# Patient Record
Sex: Male | Born: 1955 | Race: White | Hispanic: No | Marital: Married | Smoking: Never smoker
Health system: Southern US, Community
[De-identification: ages and names within clinical notes are randomized; demographics above are authoritative.]

## PROBLEM LIST (undated history)

## (undated) DIAGNOSIS — E785 Hyperlipidemia, unspecified: Secondary | ICD-10-CM

## (undated) DIAGNOSIS — I82409 Acute embolism and thrombosis of unspecified deep veins of unspecified lower extremity: Secondary | ICD-10-CM

## (undated) DIAGNOSIS — I2699 Other pulmonary embolism without acute cor pulmonale: Secondary | ICD-10-CM

## (undated) HISTORY — DX: Acute embolism and thrombosis of unspecified deep veins of unspecified lower extremity: I82.409

## (undated) HISTORY — DX: Other pulmonary embolism without acute cor pulmonale: I26.99

## (undated) HISTORY — DX: Hyperlipidemia, unspecified: E78.5

## (undated) HISTORY — PX: NO PAST SURGERIES: SHX2092

## (undated) HISTORY — PX: BACK SURGERY: SHX140

---

## 2008-09-03 ENCOUNTER — Encounter: Admission: RE | Admit: 2008-09-03 | Discharge: 2008-09-03 | Payer: Self-pay | Admitting: Sports Medicine

## 2008-09-14 ENCOUNTER — Encounter: Admission: RE | Admit: 2008-09-14 | Discharge: 2008-09-14 | Payer: Self-pay | Admitting: Sports Medicine

## 2008-10-07 ENCOUNTER — Encounter: Admission: RE | Admit: 2008-10-07 | Discharge: 2008-10-07 | Payer: Self-pay | Admitting: Sports Medicine

## 2008-12-17 ENCOUNTER — Encounter: Admission: RE | Admit: 2008-12-17 | Discharge: 2008-12-17 | Payer: Self-pay | Admitting: Sports Medicine

## 2009-04-01 ENCOUNTER — Encounter: Admission: RE | Admit: 2009-04-01 | Discharge: 2009-04-01 | Payer: Self-pay | Admitting: Sports Medicine

## 2009-08-26 ENCOUNTER — Encounter: Admission: RE | Admit: 2009-08-26 | Discharge: 2009-08-26 | Payer: Self-pay | Admitting: Sports Medicine

## 2009-12-14 ENCOUNTER — Encounter: Admission: RE | Admit: 2009-12-14 | Discharge: 2009-12-14 | Payer: Self-pay | Admitting: Sports Medicine

## 2010-08-08 ENCOUNTER — Encounter: Admission: RE | Admit: 2010-08-08 | Discharge: 2010-08-08 | Payer: Self-pay | Admitting: Sports Medicine

## 2011-01-05 ENCOUNTER — Other Ambulatory Visit: Payer: Self-pay | Admitting: Sports Medicine

## 2011-01-05 DIAGNOSIS — M545 Low back pain, unspecified: Secondary | ICD-10-CM

## 2011-01-06 ENCOUNTER — Ambulatory Visit
Admission: RE | Admit: 2011-01-06 | Discharge: 2011-01-06 | Disposition: A | Discharge status: Home / Self Care | Payer: 59 | Source: Ambulatory Visit | Attending: Sports Medicine | Admitting: Sports Medicine

## 2011-01-06 ENCOUNTER — Other Ambulatory Visit: Payer: Self-pay | Admitting: Sports Medicine

## 2011-01-06 DIAGNOSIS — M545 Low back pain, unspecified: Secondary | ICD-10-CM

## 2011-01-26 ENCOUNTER — Other Ambulatory Visit: Payer: Self-pay | Admitting: Sports Medicine

## 2011-01-26 DIAGNOSIS — M549 Dorsalgia, unspecified: Secondary | ICD-10-CM

## 2011-02-01 ENCOUNTER — Other Ambulatory Visit: Payer: Self-pay | Admitting: Sports Medicine

## 2011-02-01 ENCOUNTER — Ambulatory Visit
Admission: RE | Admit: 2011-02-01 | Discharge: 2011-02-01 | Disposition: A | Discharge status: Home / Self Care | Payer: 59 | Source: Ambulatory Visit | Attending: Sports Medicine | Admitting: Sports Medicine

## 2011-02-01 DIAGNOSIS — M549 Dorsalgia, unspecified: Secondary | ICD-10-CM

## 2011-02-07 ENCOUNTER — Other Ambulatory Visit: Payer: Self-pay | Admitting: Sports Medicine

## 2011-02-07 DIAGNOSIS — M545 Low back pain, unspecified: Secondary | ICD-10-CM

## 2011-02-08 ENCOUNTER — Other Ambulatory Visit: Payer: Self-pay | Admitting: Sports Medicine

## 2011-02-08 ENCOUNTER — Ambulatory Visit
Admission: RE | Admit: 2011-02-08 | Discharge: 2011-02-08 | Disposition: A | Discharge status: Home / Self Care | Payer: 59 | Source: Ambulatory Visit | Attending: Sports Medicine | Admitting: Sports Medicine

## 2011-02-08 DIAGNOSIS — M545 Low back pain, unspecified: Secondary | ICD-10-CM

## 2011-02-08 DIAGNOSIS — M549 Dorsalgia, unspecified: Secondary | ICD-10-CM

## 2011-02-17 ENCOUNTER — Other Ambulatory Visit: Payer: 59

## 2011-03-30 ENCOUNTER — Other Ambulatory Visit: Payer: Self-pay | Admitting: Sports Medicine

## 2011-03-30 DIAGNOSIS — M549 Dorsalgia, unspecified: Secondary | ICD-10-CM

## 2011-04-19 ENCOUNTER — Other Ambulatory Visit: Payer: 59

## 2011-05-08 ENCOUNTER — Other Ambulatory Visit: Payer: Self-pay | Admitting: Sports Medicine

## 2011-05-08 ENCOUNTER — Ambulatory Visit
Admission: RE | Admit: 2011-05-08 | Discharge: 2011-05-08 | Disposition: A | Discharge status: Home / Self Care | Payer: 59 | Source: Ambulatory Visit | Attending: Sports Medicine | Admitting: Sports Medicine

## 2011-05-08 DIAGNOSIS — M549 Dorsalgia, unspecified: Secondary | ICD-10-CM

## 2011-05-08 MED ORDER — IOHEXOL 180 MG/ML  SOLN
1.0000 mL | Freq: Once | INTRAMUSCULAR | Status: AC | PRN
  Administered 2011-05-08: 1 mL via INTRA_ARTICULAR

## 2011-05-08 MED ORDER — METHYLPREDNISOLONE ACETATE 40 MG/ML INJ SUSP (RADIOLOG
120.0000 mg | Freq: Once | INTRAMUSCULAR | Status: AC
  Administered 2011-05-08: 120 mg via INTRA_ARTICULAR

## 2011-09-26 DIAGNOSIS — I2699 Other pulmonary embolism without acute cor pulmonale: Secondary | ICD-10-CM

## 2011-09-26 HISTORY — DX: Other pulmonary embolism without acute cor pulmonale: I26.99

## 2011-11-15 ENCOUNTER — Other Ambulatory Visit: Payer: Self-pay | Admitting: Sports Medicine

## 2011-11-15 DIAGNOSIS — M545 Low back pain, unspecified: Secondary | ICD-10-CM

## 2011-11-22 ENCOUNTER — Other Ambulatory Visit: Payer: Self-pay | Admitting: Sports Medicine

## 2011-11-22 DIAGNOSIS — M549 Dorsalgia, unspecified: Secondary | ICD-10-CM

## 2011-11-24 ENCOUNTER — Other Ambulatory Visit: Payer: 59

## 2011-11-27 ENCOUNTER — Other Ambulatory Visit: Payer: Self-pay | Admitting: Sports Medicine

## 2011-11-27 ENCOUNTER — Ambulatory Visit
Admission: RE | Admit: 2011-11-27 | Discharge: 2011-11-27 | Disposition: A | Discharge status: Home / Self Care | Payer: BC Managed Care – PPO | Source: Ambulatory Visit | Attending: Sports Medicine | Admitting: Sports Medicine

## 2011-11-27 DIAGNOSIS — M545 Low back pain, unspecified: Secondary | ICD-10-CM

## 2012-01-10 ENCOUNTER — Ambulatory Visit
Admission: RE | Admit: 2012-01-10 | Discharge: 2012-01-10 | Disposition: A | Discharge status: Home / Self Care | Payer: BC Managed Care – PPO | Source: Ambulatory Visit | Attending: Sports Medicine | Admitting: Sports Medicine

## 2012-01-10 ENCOUNTER — Other Ambulatory Visit: Payer: Self-pay | Admitting: Sports Medicine

## 2012-01-10 VITALS — BP 134/73 | HR 52 | Temp 94.5°F

## 2012-01-10 DIAGNOSIS — M549 Dorsalgia, unspecified: Secondary | ICD-10-CM

## 2012-01-10 MED ORDER — IOHEXOL 180 MG/ML  SOLN
1.0000 mL | Freq: Once | INTRAMUSCULAR | Status: AC | PRN
  Administered 2012-01-10: 1 mL via EPIDURAL

## 2012-01-10 MED ORDER — KETOROLAC TROMETHAMINE 30 MG/ML IJ SOLN: 30.0000 mg | Freq: Once | INTRAMUSCULAR | Status: DC

## 2012-01-10 MED ORDER — METHYLPREDNISOLONE ACETATE 40 MG/ML INJ SUSP (RADIOLOG
120.0000 mg | Freq: Once | INTRAMUSCULAR | Status: AC
  Administered 2012-01-10: 120 mg via EPIDURAL

## 2012-01-10 MED ORDER — MIDAZOLAM HCL 2 MG/2ML IJ SOLN: 1.0000 mg | INTRAMUSCULAR | Status: DC | PRN

## 2012-01-10 MED ORDER — CEFAZOLIN SODIUM 1-5 GM-% IV SOLN: 1.0000 g | Freq: Once | INTRAVENOUS | Status: DC

## 2012-01-10 MED ORDER — SODIUM CHLORIDE 0.9 % IV SOLN: Freq: Once | INTRAVENOUS | Status: DC

## 2012-01-10 MED ORDER — FENTANYL CITRATE 0.05 MG/ML IJ SOLN: 25.0000 ug | INTRAMUSCULAR | Status: DC | PRN

## 2012-01-10 NOTE — Discharge Instructions (Signed)
Radio Frequency Ablation Post Procedure Discharge Instructions ° °1. May resume a regular diet and any medications that you routinely take (including pain medications). °2. No driving day of procedure. °3. Upon discharge go home and rest for at least 4 hours.  May use an ice pack as needed to injection sites on back. °4. Remove bandades later, today. ° ° ° °Please contact our office at [phone] for the following symptoms: ° °· Fever greater than 100 degrees °· Increased swelling, pain, or redness at injection site. ° ° °Thank you for visiting [facility]. °

## 2012-01-10 NOTE — Progress Notes (Addendum)
Dr. Benard Rink in to talk to pt at length about procedure. Pt states no back pain today but has bad bilateral leg pain to knees. Pain changes from one side to the other as the day goes on. Discharge instructions explained. Dd 0800 Dr. Benard Rink wishes to speak to Dr. Margaretha Sheffield about doing an epi instead of ablation since pt has more leg pain and no back pain.dd  0915 RF ablation cancelled and l-epi done for bilateral leg pain, the injection was okayed by Dr. Charlett Blake.

## 2012-01-26 ENCOUNTER — Other Ambulatory Visit: Payer: Self-pay | Admitting: Sports Medicine

## 2012-01-26 DIAGNOSIS — M549 Dorsalgia, unspecified: Secondary | ICD-10-CM

## 2012-01-31 ENCOUNTER — Ambulatory Visit
Admission: RE | Admit: 2012-01-31 | Discharge: 2012-01-31 | Disposition: A | Discharge status: Home / Self Care | Payer: BC Managed Care – PPO | Source: Ambulatory Visit | Attending: Sports Medicine | Admitting: Sports Medicine

## 2012-01-31 DIAGNOSIS — M549 Dorsalgia, unspecified: Secondary | ICD-10-CM

## 2012-01-31 MED ORDER — METHYLPREDNISOLONE ACETATE 40 MG/ML INJ SUSP (RADIOLOG
120.0000 mg | Freq: Once | INTRAMUSCULAR | Status: AC
  Administered 2012-01-31: 120 mg via EPIDURAL

## 2012-01-31 MED ORDER — IOHEXOL 180 MG/ML  SOLN
1.0000 mL | Freq: Once | INTRAMUSCULAR | Status: AC | PRN
  Administered 2012-01-31: 1 mL via EPIDURAL

## 2012-03-06 ENCOUNTER — Other Ambulatory Visit: Payer: Self-pay | Admitting: Sports Medicine

## 2012-03-06 DIAGNOSIS — M549 Dorsalgia, unspecified: Secondary | ICD-10-CM

## 2012-03-19 ENCOUNTER — Ambulatory Visit
Admission: RE | Admit: 2012-03-19 | Discharge: 2012-03-19 | Disposition: A | Discharge status: Home / Self Care | Payer: BC Managed Care – PPO | Source: Ambulatory Visit | Attending: Sports Medicine | Admitting: Sports Medicine

## 2012-03-19 ENCOUNTER — Other Ambulatory Visit: Payer: Self-pay | Admitting: Sports Medicine

## 2012-03-19 VITALS — BP 131/76 | HR 50 | Temp 96.2°F | Resp 10

## 2012-03-19 DIAGNOSIS — M549 Dorsalgia, unspecified: Secondary | ICD-10-CM

## 2012-03-19 MED ORDER — CEFAZOLIN SODIUM 1-5 GM-% IV SOLN
1.0000 g | Freq: Once | INTRAVENOUS | Status: AC
  Administered 2012-03-19: 1 g via INTRAVENOUS

## 2012-03-19 MED ORDER — FENTANYL CITRATE 0.05 MG/ML IJ SOLN
25.0000 ug | INTRAMUSCULAR | Status: DC | PRN
  Administered 2012-03-19 (×2): 50 ug via INTRAVENOUS

## 2012-03-19 MED ORDER — MIDAZOLAM HCL 2 MG/2ML IJ SOLN
1.0000 mg | INTRAMUSCULAR | Status: DC | PRN
  Administered 2012-03-19 (×2): 1 mg via INTRAVENOUS

## 2012-03-19 MED ORDER — SODIUM CHLORIDE 0.9 % IV SOLN
Freq: Once | INTRAVENOUS | Status: AC
  Administered 2012-03-19 (×2): via INTRAVENOUS

## 2012-03-19 MED ORDER — KETOROLAC TROMETHAMINE 30 MG/ML IJ SOLN
30.0000 mg | Freq: Once | INTRAMUSCULAR | Status: AC
  Administered 2012-03-19: 30 mg via INTRAVENOUS

## 2012-03-19 NOTE — Progress Notes (Signed)
Dr. Benard Rink in to speak to pt and his wife, Kenneth Adams. Pt awake and alert at present.

## 2012-03-19 NOTE — Discharge Instructions (Signed)
Radio Frequency Ablation Procedure Discharge Instructions  1. May resume a regular diet and any medications that you routinely take (including pain medications). 2. No driving day of procedure. 3. Upon discharge go home and rest for at least 4 hours.  May use an ice pack as needed to injection sites on back. 4. Remove bandades later, today.    Please contact our office at [phone] for the following symptoms:   Fever greater than 100 degrees  Increased swelling, pain, or redness at injection site.   Thank you for visiting [facility].

## 2012-03-19 NOTE — Progress Notes (Signed)
Pt's only history is a history of back pain, but no surgeries in the past. Has had facet injections as well as epidural in the past with good relief.

## 2012-04-16 ENCOUNTER — Other Ambulatory Visit: Payer: Self-pay | Admitting: Orthopedic Surgery

## 2012-04-16 DIAGNOSIS — M545 Low back pain, unspecified: Secondary | ICD-10-CM

## 2012-08-13 ENCOUNTER — Ambulatory Visit
Admission: RE | Admit: 2012-08-13 | Discharge: 2012-08-13 | Disposition: A | Discharge status: Home / Self Care | Payer: BC Managed Care – PPO | Source: Ambulatory Visit | Attending: *Deleted | Admitting: *Deleted

## 2012-08-13 ENCOUNTER — Other Ambulatory Visit: Payer: Self-pay | Admitting: *Deleted

## 2012-08-13 DIAGNOSIS — M4310 Spondylolisthesis, site unspecified: Secondary | ICD-10-CM

## 2014-08-24 ENCOUNTER — Ambulatory Visit (HOSPITAL_BASED_OUTPATIENT_CLINIC_OR_DEPARTMENT_OTHER): Payer: BC Managed Care – PPO | Admitting: Radiology

## 2014-08-24 ENCOUNTER — Encounter: Payer: Self-pay | Admitting: Cardiology

## 2014-08-24 ENCOUNTER — Ambulatory Visit (INDEPENDENT_AMBULATORY_CARE_PROVIDER_SITE_OTHER): Payer: BC Managed Care – PPO | Admitting: Cardiology

## 2014-08-24 ENCOUNTER — Ambulatory Visit (HOSPITAL_COMMUNITY): Payer: BC Managed Care – PPO | Attending: Cardiology | Admitting: Cardiology

## 2014-08-24 VITALS — BP 120/82 | HR 65 | Ht 72.0 in | Wt 200.8 lb

## 2014-08-24 DIAGNOSIS — E785 Hyperlipidemia, unspecified: Secondary | ICD-10-CM | POA: Insufficient documentation

## 2014-08-24 DIAGNOSIS — R0602 Shortness of breath: Secondary | ICD-10-CM | POA: Diagnosis not present

## 2014-08-24 DIAGNOSIS — R079 Chest pain, unspecified: Secondary | ICD-10-CM | POA: Insufficient documentation

## 2014-08-24 DIAGNOSIS — R9431 Abnormal electrocardiogram [ECG] [EKG]: Secondary | ICD-10-CM

## 2014-08-24 DIAGNOSIS — R0789 Other chest pain: Secondary | ICD-10-CM

## 2014-08-24 DIAGNOSIS — I2699 Other pulmonary embolism without acute cor pulmonale: Secondary | ICD-10-CM | POA: Insufficient documentation

## 2014-08-24 MED ORDER — TECHNETIUM TC 99M SESTAMIBI GENERIC - CARDIOLITE
10.0000 | Freq: Once | INTRAVENOUS | Status: AC | PRN
  Administered 2014-08-24: 10 via INTRAVENOUS

## 2014-08-24 MED ORDER — TECHNETIUM TC 99M SESTAMIBI GENERIC - CARDIOLITE
30.0000 | Freq: Once | INTRAVENOUS | Status: AC | PRN
  Administered 2014-08-24: 30 via INTRAVENOUS

## 2014-08-24 NOTE — Patient Instructions (Addendum)
Dr. Mayford Knifeurner recommends you have a NUCLEAR STRESS TEST TOMORROW, DEC. 1st.  Your physician has requested that you have an echocardiogram TOMORROW, DEC. 1st. Echocardiography is a painless test that uses sound waves to create images of your heart. It provides your doctor with information about the size and shape of your heart and how well your heart's chambers and valves are working. This procedure takes approximately one hour. There are no restrictions for this procedure.  Your physician recommends that you schedule a follow-up appointment AS NEEDED with Dr. Mayford Knifeurner.

## 2014-08-24 NOTE — Progress Notes (Signed)
[address] [place], [place]  [address] Phone: [phone] Fax:  [phone]  Date:  08/24/2014   ID:  Kenneth PernaWilliam Adams, DOB Nov 10, 1955, MRN [id number]  PCP:  No primary care provider on file.  Cardiologist:  Kenneth Adams Kenneth Oliff, MD    History of Present Illness: Kenneth Adams is a 58 y.o. male with no prior cardiac history who recently saw his PCP, Dr. Foy Adams, for an episode of SOB about a month ago.  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   This occurred while walking on flat ground but in another city with higher altitude.  The symptoms were very "slight" and only lasted a few minutes. Recently he has had some chest pains that he has a hard time describing in the left upper chest but resolves with belching usually.  It is nonexertional.  He denies any diaphoresis or nausea with the pain.   He denies any dizziness, palpitations, LE edema or syncope.  He had an EKG on 05/31/2014 that was normal.  His cardiac risk factors include dyslipidemia, male sex and age.  He is a nonsmoker with no family history of CAD and no HTN or DM.  He is now referred to Cardiology for stress test.    Wt Readings from Last 3 Encounters:  08/24/14 200 lb 12.8 oz (91.082 kg)  11/27/11 195 lb (88.451 kg)     Past Medical History  Diagnosis Date  . Hyperlipidemia   . Pulmonary embolism 2013    occurred after back surgyer    Current Outpatient Prescriptions  Medication Sig Dispense Refill  . aspirin 81 MG chewable tablet Chew 81 mg by mouth.    . celecoxib (CELEBREX) 200 MG capsule Take 200 mg by mouth.    . rosuvastatin (CRESTOR) 10 MG tablet      No current facility-administered medications for this visit.    Allergies:    Allergies  Allergen Reactions  . Rivaroxaban Nausea Only    Other reaction(s): Other Extreme tiredness    Social History:  The patient  reports that he has never smoked. He has never used smokeless tobacco. He reports that he drinks  alcohol.   Family History:  The patient's family history includes Cancer - Lung in his mother.   ROS:  Please see the history of present illness.      All other systems reviewed and negative.   PHYSICAL EXAM: VS:  BP 120/82 mmHg  Pulse 65  Ht 6' (1.829 m)  Wt 200 lb 12.8 oz (91.082 kg)  BMI 27.23 kg/m2 Well nourished, well developed, in no acute distress HEENT: normal Neck: no JVD Cardiac:  normal S1, S2; RRR; no murmur Lungs:  clear to auscultation bilaterally, no wheezing, rhonchi or rales Abd: soft, nontender, no hepatomegaly Ext: no edema Skin: warm and dry Neuro:  CNs 2-12 intact, no focal abnormalities noted  EKG:     NSR at 65bpm with inferior infarct and anterior infarct - no change from EKG 2009  ASSESSMENT AND PLAN:  1. Atypical chest pain that he says started after he was told he needed to see a Cardiologist.  There are no associated symptoms with the discomfort and it usually resolves with belching.   2. SOB - 1 episode while walking on flat ground in a city at high altitude.  EKG shows poor R wave progression in the anterior leads and Q waves in III and aVF but this is unchanged from  2009.  I will get a stress myoview to rule out ischemia and 2D echo to assess LVF.  I have instructed him not to fly until stress test results in.   3. Dyslipidemia.  Continue Crestor  Followup with me PRN pending results of studies.  Signed, Kenneth Adams Farrell Broerman, MD [facility] 08/24/2014 9:45 AM

## 2014-08-24 NOTE — Progress Notes (Signed)
Echo performed. 

## 2014-08-24 NOTE — Progress Notes (Signed)
[facility] SITE 3 NUCLEAR MED [address] [place], [place] [address] [phone]    Cardiology Nuclear Med Study  Kenneth PernaWilliam Staub is a 58 y.o. male     MRN : [id number]     DOB: [birth year]  Procedure Date: 08/24/2014  Nuclear Med Background Indication for Stress Test:  Evaluation for Ischemia History:  MPI 15 yrs ago  Cardiac Risk Factors: Lipids  Symptoms:  Chest Pain and SOB   Nuclear Pre-Procedure Caffeine/Decaff Intake:  None> 12 hrs NPO After: 8:00pm   Lungs:  clear O2 Sat: 98% on room air. IV 0.9% NS with Angio Cath:  22g  IV Site: R Hand, tolerated well IV Started by:  Irean HongPatsy Edwards, RN  Chest Size (in):  44 Cup Size: n/a  Height: 6' (1.829 m)  Weight:  196 lb (88.905 kg)  BMI:  Body mass index is 26.58 kg/(m^2). Tech Comments:  N/A    Nuclear Med Study 1 or 2 day study: 1 day  Stress Test Type:  Stress  Reading MD: N/A  Order Authorizing Provider:  Armanda Magicraci Turner, MD  Resting Radionuclide: Technetium 4227m Sestamibi  Resting Radionuclide Dose: 11.0 mCi   Stress Radionuclide:  Technetium 2527m Sestamibi  Stress Radionuclide Dose: 33.0 mCi           Stress Protocol Rest HR: 56 Stress HR: 155  Rest BP: 129/66 Stress BP: 193/81  Exercise Time (min): 12:00 METS: 13.40   Predicted Max HR: 163 bpm % Max HR: 95.09 bpm Rate Pressure Product: [id number]   Dose of Adenosine (mg):  n/a Dose of Lexiscan: n/a mg  Dose of Atropine (mg): n/a Dose of Dobutamine: n/a mcg/kg/min (at max HR)  Stress Test Technologist: Milana NaSabrina Williams, EMT-P  Nuclear Technologist:  Kerby NoraElzbieta Kubak, CNMT     Rest Procedure:  Myocardial perfusion imaging was performed at rest 45 minutes following the intravenous administration of Technetium 4027m Sestamibi. Rest ECG: Sinus bradycardia. no ST or T wave changes.   Stress Procedure:  The patient exercised on the treadmill utilizing the Bruce Protocol for 12:00 minutes. The patient stopped due to fatigue and denied any chest pain.   Technetium 1827m Sestamibi was injected at peak exercise and myocardial perfusion imaging was performed after a brief delay. Stress ECG: No significant change from baseline ECG  QPS Raw Data Images:  Normal; no motion artifact; normal heart/lung ratio. Stress Images:  Normal homogeneous uptake in all areas of the myocardium. Rest Images:  Normal homogeneous uptake in all areas of the myocardium. Subtraction (SDS):  No evidence of ischemia. Transient Ischemic Dilatation (Normal <1.22):  0.84 Lung/Heart Ratio (Normal <0.45):  0.34  Quantitative Gated Spect Images QGS EDV:  96 ml QGS ESV:  36 ml  Impression Exercise Capacity:  Good exercise capacity. BP Response:  Normal blood pressure response. Clinical Symptoms:  No significant symptoms noted.  He had PVCs during the recovery phase and was able to feel them.  He stated that this was the same symptom that presented with.  ECG Impression:  No significant ST segment change suggestive of ischemia.  Occasional PVCs during the recovery phase.  Comparison with Prior Nuclear Study: No images to compare  Overall Impression:  Normal stress nuclear study.  No evidence of ischemia.   LV Ejection Fraction: 61%.  LV Wall Motion:  NL LV Function; NL Wall Motion.   Vesta MixerPhilip J. Nahser, Montez HagemanJr., MD, [facility] 08/24/2014, 4:57 PM [address],  [address] Office [phone] Pager [phone]

## 2014-08-26 ENCOUNTER — Telehealth: Payer: Self-pay | Admitting: Cardiology

## 2014-08-26 ENCOUNTER — Telehealth: Payer: Self-pay

## 2014-08-26 NOTE — Telephone Encounter (Signed)
Pt requests Dr. Mayford Knifeurner put the terms "clinically insignificant" to recent testing if possible (ECHO and stress test) in case his files are pulled by the [employer].   To Dr. Mayford Knifeurner for review.

## 2014-08-26 NOTE — Telephone Encounter (Signed)
Spoke with patient about recent stress test and ECHO results.  Added patients wife to demographics as DPR.

## 2014-08-26 NOTE — Telephone Encounter (Signed)
New message    Patient is giving permission  For his wife to receive information from nurse.    Patient is a pilot about to leave.

## 2014-08-26 NOTE — Telephone Encounter (Signed)
To whom it may concern, Kenneth Adams recently underwent stress myoview that showed no evidence of coronary ischemia and normal LVF.  2D echo showed normal LVF.  Patient is stable from a cardiac standpoint for continuing job as a Occupational hygienistpilot.

## 2014-09-04 ENCOUNTER — Telehealth: Payer: Self-pay | Admitting: Cardiology

## 2014-09-04 NOTE — Telephone Encounter (Signed)
Spoke with Natalia LeatherwoodKatherine and informed her that per our EPIC records, there are no documented calls to her office.  Reviewed results of stress test and ECHO.  Sending OV to office per request.

## 2014-09-04 NOTE — Telephone Encounter (Signed)
New Message  Received a call indicating the patient wasn't a candidate for CHF// please call to dicuss further

## 2014-11-03 ENCOUNTER — Other Ambulatory Visit: Payer: Self-pay | Admitting: Family Medicine

## 2014-11-03 DIAGNOSIS — Z8249 Family history of ischemic heart disease and other diseases of the circulatory system: Secondary | ICD-10-CM

## 2014-11-12 ENCOUNTER — Ambulatory Visit (INDEPENDENT_AMBULATORY_CARE_PROVIDER_SITE_OTHER): Payer: BLUE CROSS/BLUE SHIELD

## 2014-11-12 DIAGNOSIS — I77811 Abdominal aortic ectasia: Secondary | ICD-10-CM

## 2014-11-12 DIAGNOSIS — Z8249 Family history of ischemic heart disease and other diseases of the circulatory system: Secondary | ICD-10-CM

## 2015-01-05 ENCOUNTER — Encounter: Payer: Self-pay | Admitting: Vascular Surgery

## 2015-01-06 ENCOUNTER — Ambulatory Visit (INDEPENDENT_AMBULATORY_CARE_PROVIDER_SITE_OTHER): Payer: BLUE CROSS/BLUE SHIELD | Admitting: Vascular Surgery

## 2015-01-06 ENCOUNTER — Encounter: Payer: Self-pay | Admitting: Vascular Surgery

## 2015-01-06 VITALS — BP 125/80 | HR 58 | Ht 72.0 in | Wt 202.0 lb

## 2015-01-06 DIAGNOSIS — I723 Aneurysm of iliac artery: Secondary | ICD-10-CM

## 2015-01-06 DIAGNOSIS — I714 Abdominal aortic aneurysm, without rupture, unspecified: Secondary | ICD-10-CM

## 2015-01-06 NOTE — Progress Notes (Signed)
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  HPI: Kenneth Adams is a 59 y.o. male whose father had an abdominal aortic aneurysm diagnosed in his 60s. This reason an ultrasound screening study was recommended. This showed that he had an infrarenal aorta that measured 2.8 cm in maximum diameter and both common iliac arteries measured 1.8 cm in maximum diameter. He was therefore sent for vascular consultation. He denies any history of abdominal pain or back pain. He is not a smoker. His blood pressure has been well controlled.  I have reviewed the records from Dr. Joycelyn Rua office. The patient's father had an abdominal aortic aneurysm in his 58s. He therefore underwent a screening test for aneurysmal disease. He has normal renal function.  Past Medical History  Diagnosis Date  . Hyperlipidemia   . Pulmonary embolism 2013    occurred after back surgyer  . DVT (deep venous thrombosis)    Family History  Problem Relation Age of Onset  . Cancer - Lung Mother   . Cancer Mother   . Hyperlipidemia Mother   . Hyperlipidemia Father    He does have a family history of abdominal aortic aneurysms.  SOCIAL HISTORY: History  Substance Use Topics  . Smoking status: Never Smoker   . Smokeless tobacco: Never Used  . Alcohol Use: 3.0 oz/week    3 Glasses of wine, 2 Shots of liquor per week     Comment: 2-3x weekly   He is a Occupational hygienist.  Allergies  Allergen Reactions  . Rivaroxaban Nausea Only    Other reaction(s): Other Extreme tiredness   Current Outpatient Prescriptions  Medication Sig Dispense Refill  . aspirin 81 MG chewable tablet Chew 81 mg by mouth.    . celecoxib (CELEBREX) 200 MG capsule Take 200 mg by mouth.    . rosuvastatin (CRESTOR) 10 MG tablet      No current facility-administered medications for this visit.     REVIEW OF SYSTEMS: Arly.Keller ] denotes positive finding; [  ] denotes negative finding  CARDIOVASCULAR:   chest pain    chest pressure    palpitations    orthopnea    dyspnea on exertion    claudication    rest pain    DVT    phlebitis PULMONARY:    productive cough    asthma    wheezing NEUROLOGIC:    weakness   paresthesias   aphasia   amaurosis   dizziness HEMATOLOGIC:    bleeding problems    clotting disorders MUSCULOSKELETAL:   joint pain    joint swelling  leg swelling GASTROINTESTINAL:   blood in stool    hematemesis GENITOURINARY:    dysuria    hematuria PSYCHIATRIC:   history of major depression INTEGUMENTARY:   rashes   ulcers CONSTITUTIONAL:   fever    chills  PHYSICAL EXAM: Filed Vitals:   01/06/15 1009  BP: 125/80  Pulse: 58  Height: 6' (1.829 m)  Weight: 202 lb (91.627 kg)  SpO2: 100%   GENERAL: The patient is a well-nourished male, in no acute distress. The vital signs are documented above. CARDIOVASCULAR: There is a regular rate and rhythm. I do not check carotid  bruits. He has palpable femoral, popliteal, and posterior tibial pulses bilaterally. PULMONARY: There is good air exchange bilaterally without wheezing or rales. ABDOMEN: Soft and non-tender with normal pitched bowel sounds. I cannot palpate his aneurysm. MUSCULOSKELETAL: There are no major deformities or cyanosis. NEUROLOGIC: No focal weakness or paresthesias are detected. SKIN: There are no ulcers or rashes noted. PSYCHIATRIC: The patient has a normal affect.  DATA:  I have reviewed his ultrasound dated 11/12/2014 which shows that the maximum diameter of his infrarenal aorta is 2.8 cm. The iliac arteries are 1.8 cm in maximum diameter.  His lab work on 08/07/2014 shows a PSA of 1.6. HDL is 68. Triglycerides is 1:30.  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 Typically we would call this an aneurysm when the arteries 1-1/2 times normal which is approximately 3 cm. I have recommended a follow up ultrasound in 2 years. We will have him see our nurse practitioner at that time. If there has been no change at that time we can stretch the follow up out even further. Gently he is not a smoker. He is on aspirin and is on a statin.  DICKSON,CHRISTOPHER S [facility] Specialists of [place] Beeper: [phone]

## 2015-05-08 IMAGING — US US RETROPERITONEAL LIMITED
1 series · 14 of 25 positions shown · non-contrast
Comparison: None.

CLINICAL DATA: Family history of abdominal aortic aneurysm.

EXAM:
ULTRASOUND OF ABDOMINAL AORTA
TECHNIQUE: Ultrasound examination of the abdominal aorta was performed to
evaluate for abdominal aortic aneurysm.

[Series 1: us retroperitoneal limited · 0.27mm/px · 14 of 31 slices shown]
[im 1/31]
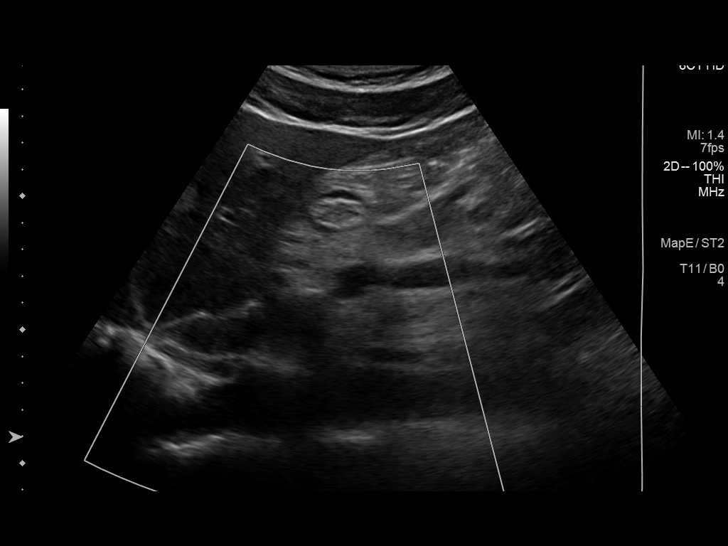
[im 3/31]
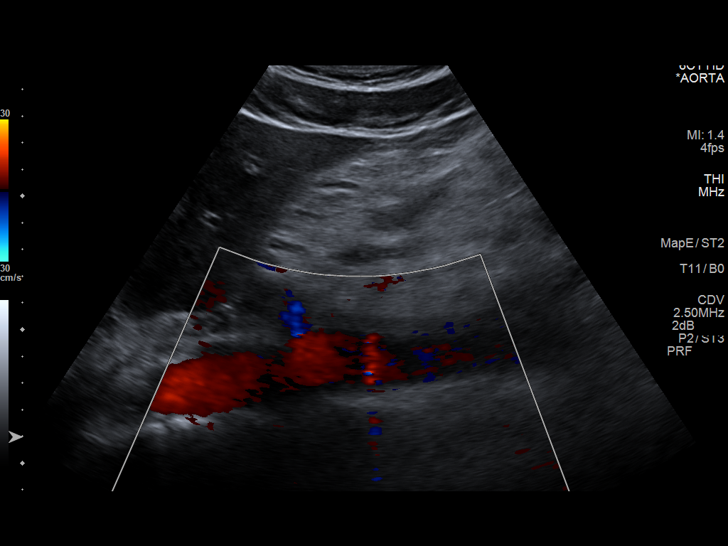
[im 6/31]
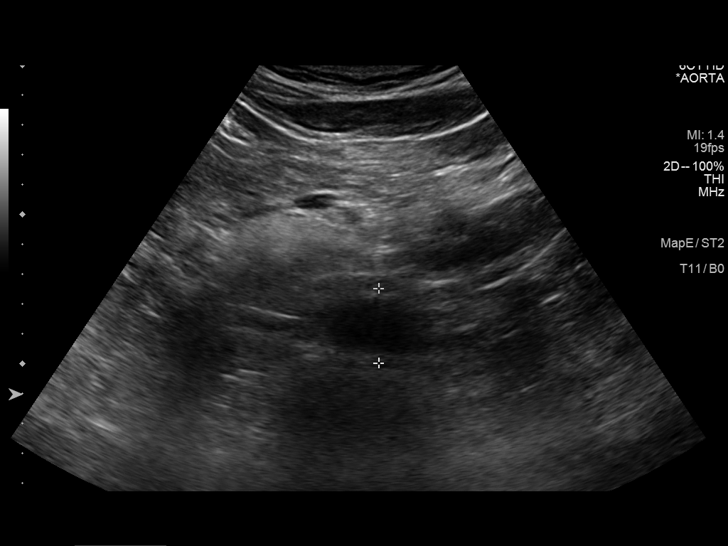
[im 8/31]
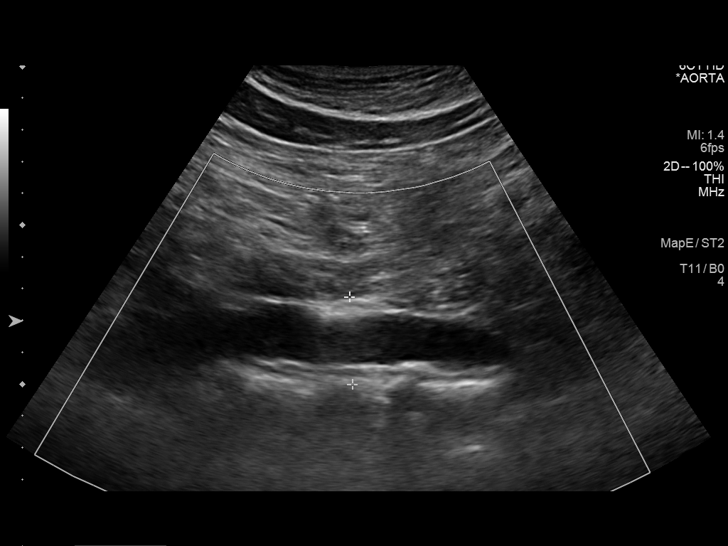
[im 11/31]
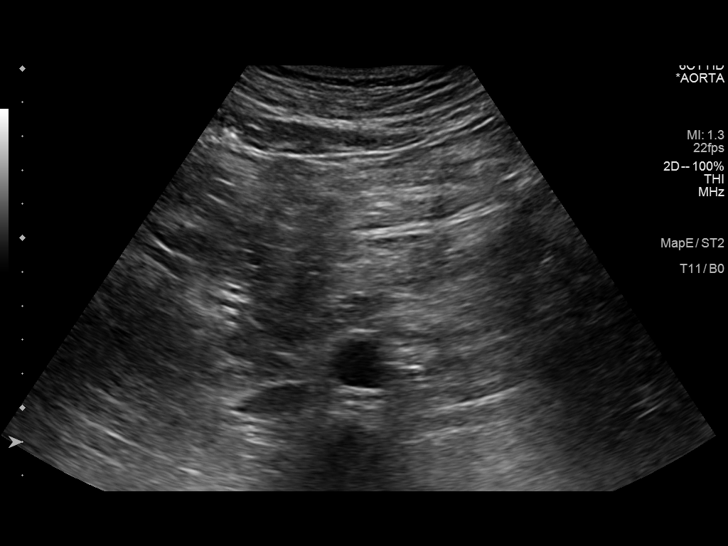
[im 12/31]
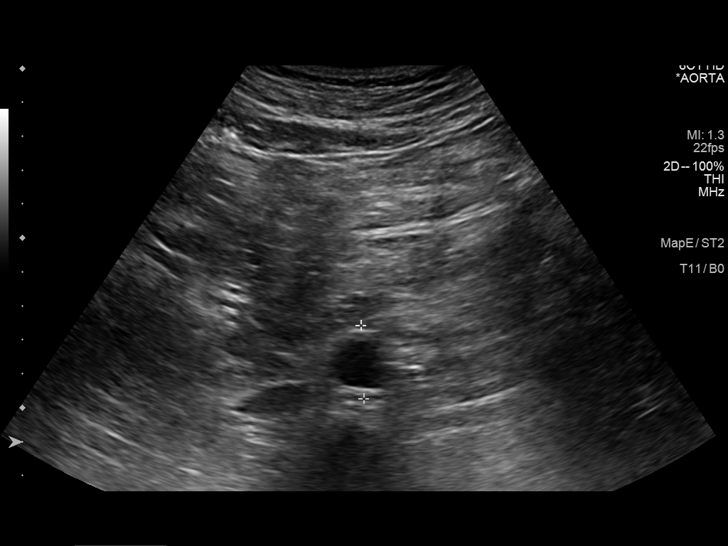
[im 14/31]
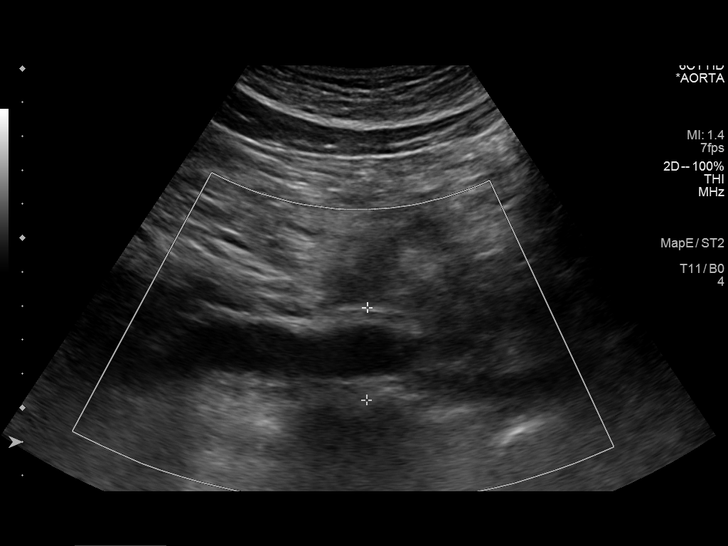
[im 17/31]
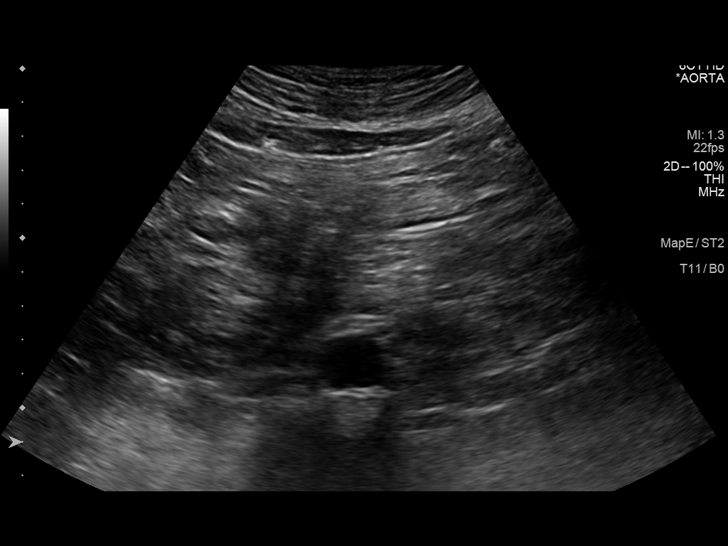
[im 19/31]
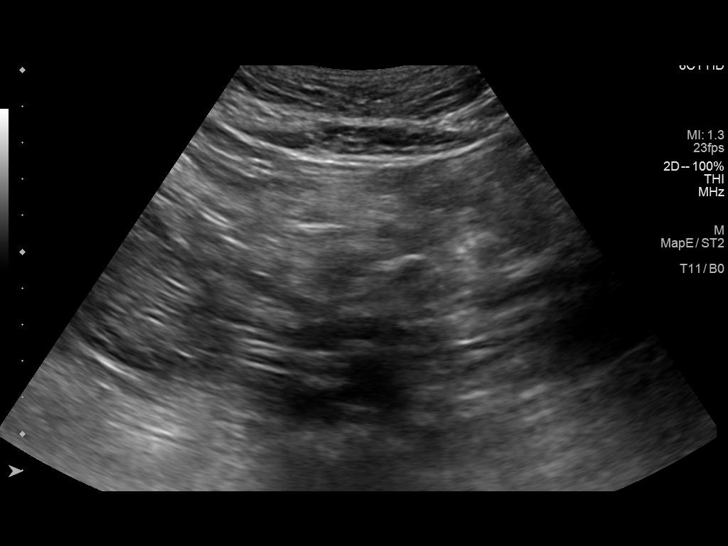
[im 21/31]
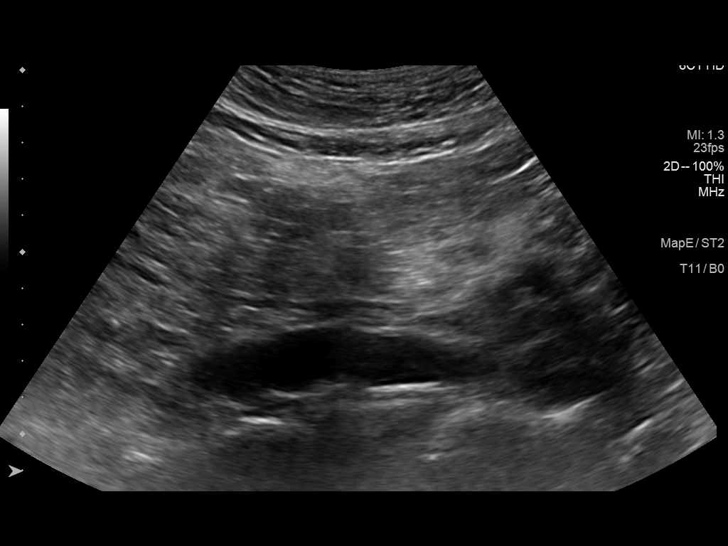
[im 23/31]
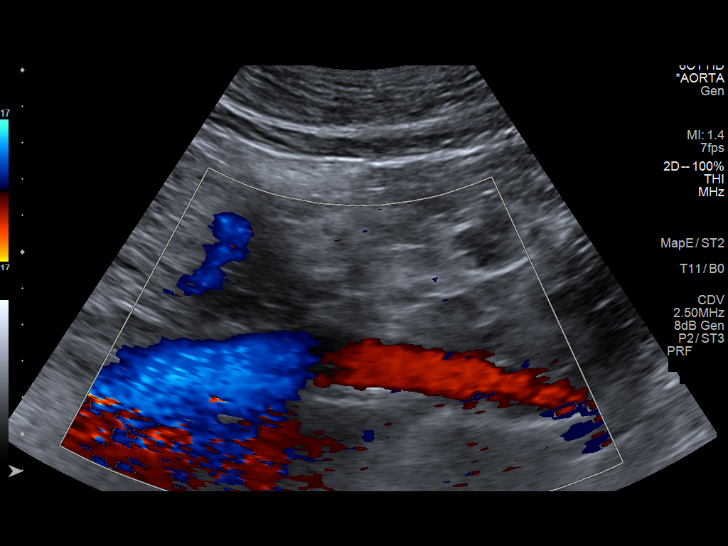
[im 26/31]
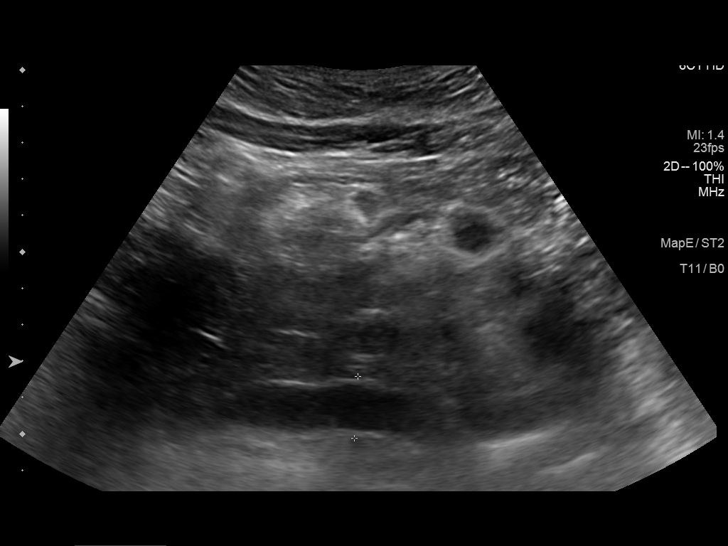
[im 28/31]
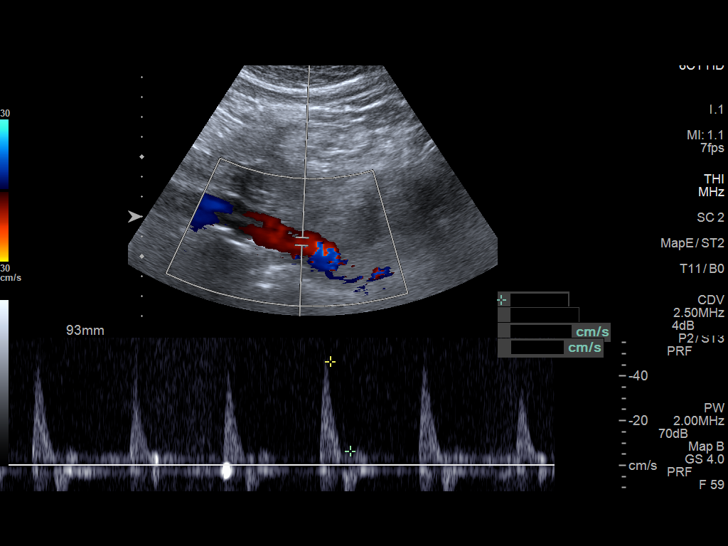
[im 31/31]
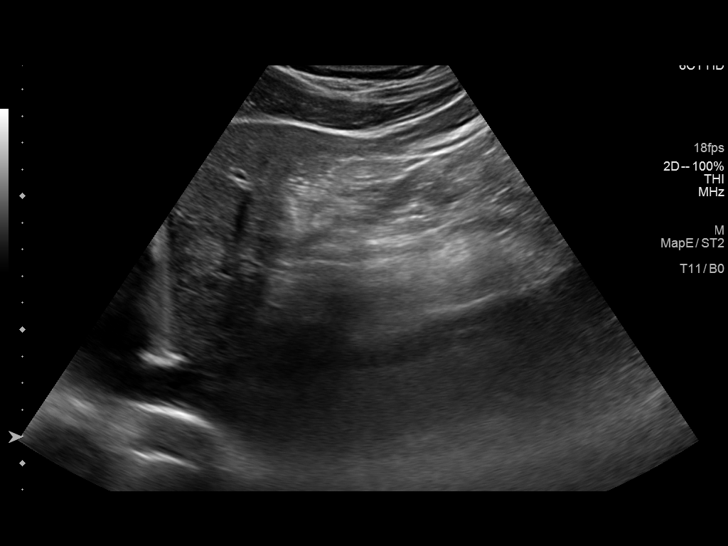

[14 of 25 positions shown; findings below may reference images not displayed]

FINDINGS: Abdominal Aorta

Mild abdominal aortic ectasia noted to 2.8 cm.

Maximum AP

Diameter:  2.8 cm

Maximum TRV

Diameter: 2.1 cm

Bilateral common iliac artery ectasia to approximately 1.7 - 1.8 cm
noted.
IMPRESSION: 1. Abdominal aortic ectasia tube maximum diameter 2.8 cm. Ectatic
abdominal aorta at risk for aneurysm development. Recommend followup
by ultrasound in 5 years. This recommendation follows ACR consensus
guidelines: White Paper of the ACR Incidental Findings Committee II
on Vascular Findings. [HOSPITAL] 0956; [DATE].
2. Bilateral prominent common iliac artery ectasia to approximately
1.7-1.8 cm. Vascular surgery consultation is suggested to further
evaluate.

## 2016-09-21 DIAGNOSIS — M5010 Cervical disc disorder with radiculopathy, unspecified cervical region: Secondary | ICD-10-CM | POA: Insufficient documentation

## 2016-09-29 HISTORY — PX: NECK SURGERY: SHX720

## 2016-12-26 ENCOUNTER — Other Ambulatory Visit: Payer: Self-pay

## 2016-12-26 DIAGNOSIS — I714 Abdominal aortic aneurysm, without rupture, unspecified: Secondary | ICD-10-CM

## 2017-01-03 DIAGNOSIS — Z981 Arthrodesis status: Secondary | ICD-10-CM | POA: Insufficient documentation

## 2017-01-10 ENCOUNTER — Ambulatory Visit: Payer: BLUE CROSS/BLUE SHIELD | Admitting: Family

## 2017-01-10 ENCOUNTER — Other Ambulatory Visit (HOSPITAL_COMMUNITY): Payer: BLUE CROSS/BLUE SHIELD

## 2017-02-06 ENCOUNTER — Encounter: Payer: Self-pay | Admitting: Family

## 2017-02-14 ENCOUNTER — Ambulatory Visit: Payer: BLUE CROSS/BLUE SHIELD | Admitting: Family

## 2017-02-14 ENCOUNTER — Ambulatory Visit (INDEPENDENT_AMBULATORY_CARE_PROVIDER_SITE_OTHER): Payer: BLUE CROSS/BLUE SHIELD | Admitting: Family

## 2017-02-14 ENCOUNTER — Encounter: Payer: Self-pay | Admitting: Family

## 2017-02-14 ENCOUNTER — Ambulatory Visit (HOSPITAL_COMMUNITY)
Admission: RE | Admit: 2017-02-14 | Discharge: 2017-02-14 | Disposition: A | Discharge status: Home / Self Care | Payer: BLUE CROSS/BLUE SHIELD | Source: Ambulatory Visit | Attending: Family | Admitting: Family

## 2017-02-14 ENCOUNTER — Other Ambulatory Visit (HOSPITAL_COMMUNITY): Payer: BLUE CROSS/BLUE SHIELD

## 2017-02-14 VITALS — BP 162/91 | HR 52 | Temp 97.1°F | Resp 20 | Ht 72.0 in | Wt 205.0 lb

## 2017-02-14 DIAGNOSIS — N281 Cyst of kidney, acquired: Secondary | ICD-10-CM | POA: Diagnosis not present

## 2017-02-14 DIAGNOSIS — I714 Abdominal aortic aneurysm, without rupture, unspecified: Secondary | ICD-10-CM

## 2017-02-14 DIAGNOSIS — I77819 Aortic ectasia, unspecified site: Secondary | ICD-10-CM | POA: Diagnosis not present

## 2017-02-14 NOTE — Progress Notes (Signed)
VASCULAR & VEIN SPECIALISTS OF [place]   CC: Follow up Abdominal Aortic Ectasia  History of Present Illness  Kenneth Adams is a 61 y.o. ([birth year]) male whose father had an abdominal aortic aneurysm diagnosed in his 33s. This reason an ultrasound screening study was recommended. He returns today for 2 year follow up.   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   At pt's 01-06-15 visit Dr. Edilia Bo reviewed the records from Dr. Joycelyn Rua office. The patient's father had an abdominal aortic aneurysm in his 80s. He therefore underwent a screening test for aneurysmal disease. This showed that he had an infrarenal aorta that measured 2.8 cm in maximum diameter and both common iliac arteries measured 1.8 cm in maximum diameter. He was therefore sent for vascular consultation. He denies any history of abdominal pain or back pain. He is not a smoker. His blood pressure has been well controlled. He has normal renal function. At that visit Dr. Edilia Bo did not consider the infrarenal aorta aneurysmal. Typically we would call this an aneurysm when the arteries 1-1/2 times normal which is approximately 3 cm. Dr. Edilia Bo recommended a follow up ultrasound in 2 years. He was to see our nurse practitioner at that time. If there has been no change at that time we can stretch the follow up out even further.   He had C5-6 fusion done in [place] in January 2018 as an outpatient, Dr. Orvan Falconer, [facility], with good results.  He had DVT and PE after a back surgery in 2013. He had an adverse reaction to Xarelto. He takes an 81 mg ASA and Crestor.   The patient denies claudication in legs with walking. The patient denies any history of stroke or TIA symptoms.  Pt Diabetic: No Pt smoker: non-smoker  Past Medical History:  Diagnosis Date  . DVT (deep venous thrombosis) (HCC)   . Hyperlipidemia   . Pulmonary  embolism (HCC) 2013   occurred after back surgyer   Past Surgical History:  Procedure Laterality Date  . BACK SURGERY    . NECK SURGERY  09/29/2016  . NO PAST SURGERIES     Social History Social History   Social History  . Marital status: Married    Spouse name: N/A  . Number of children: N/A  . Years of education: N/A   Occupational History  . Not on file.   Social History Main Topics  . Smoking status: Never Smoker  . Smokeless tobacco: Never Used  . Alcohol use 3.0 oz/week    3 Glasses of wine, 2 Shots of liquor per week     Comment: 2-3x weekly  . Drug use: No  . Sexual activity: Not on file   Other Topics Concern  . Not on file   Social History Narrative  . No narrative on file   Family History Family History  Problem Relation Age of Onset  . Cancer - Lung Mother   . Cancer Mother   . Hyperlipidemia Mother   . Hyperlipidemia Father     Current Outpatient Prescriptions on File Prior to Visit  Medication Sig Dispense Refill  . aspirin 81 MG chewable tablet Chew 81 mg by mouth.    . rosuvastatin (CRESTOR) 10 MG tablet     . celecoxib (CELEBREX) 200 MG capsule Take 200 mg by mouth.     No current facility-administered medications on file prior to visit.  Allergies  Allergen Reactions  . Rivaroxaban Nausea Only    Other reaction(s): Other Extreme tiredness    ROS: See HPI for pertinent positives and negatives.  Physical Examination  Vitals:   02/14/17 0854 02/14/17 0856  BP: (!) 156/85 (!) 162/91  Pulse: (!) 52   Resp: 20   Temp: 97.1 F (36.2 C)   TempSrc: Oral   SpO2: 98%   Weight: 205 lb (93 kg)   Height: 6' (1.829 m)    Body mass index is 27.8 kg/m.  General: A&O x 3, WD, fit appearing male.  Pulmonary: Sym exp, respirations are non labored, good air movt, CTAB, no rales, rhonchi, or wheezing.  Cardiac: RRR, Nl S1, S2, no appreciable murmur.   Carotid Bruits Right Left   Negative Negative   Aorta is not palpable Radial  pulses are 2+ palpable bilaterally                          VASCULAR EXAM:                                                                                                         LE Pulses Right Left       FEMORAL  not palpable  not palpable        POPLITEAL  not palpable   1+ palpable       POSTERIOR TIBIAL  2+ palpable   3+ palpable        DORSALIS PEDIS      ANTERIOR TIBIAL not palpable  not palpable      Gastrointestinal: soft, NTND, -G/R, - HSM, - masses palpated, - CVAT B.  Musculoskeletal: M/S 5/5 throughout, Extremities without ischemic changes.  Neurologic: CN 2-12 intact, Pain and light touch intact in extremities are intact, Motor exam as listed above.  Non-Invasive Vascular Imaging  AAA Duplex (02/14/2017)  [facility] (Date: 11-12-14)  Current size:  2.6 cm (Date: 02-14-17), Right CIA: not visualized; Left CIA: 1.41 cm  Right kidney cyst measuring 3.7 cm x 3.73 cm  Somewhat technically difficult exam due to bowel gas  Medical Decision Making  The patient is a 61 y.o. male who presents with asymptomatic AAA with no increase in size, based on limited visualization. Right kidney cyst measuring 3.7 cm x 3.73 cm; pt states he is aware of a kidney cyst; I advised him to ask his PCP about monitoring of this.    Based on this patient's exam and diagnostic studies, the patient will follow up in 2 years  with the following studies: AAA duplex.  Consideration for repair of AAA would be made when the size is 5.0 cm, growth > 1 cm/yr, and symptomatic status.  I emphasized the importance of maximal medical management including strict control of blood pressure, blood glucose, and lipid levels, antiplatelet agents, obtaining regular exercise, and continued cessation of smoking.   The patient was given information about AAA including signs, symptoms, treatment, and how to minimize the risk of enlargement and  rupture of aneurysms.    The  patient was advised to call 911 should the patient experience sudden onset abdominal or back pain.   Thank you for allowing [place] to participate in this patient's care.  Charisse March, RN, MSN, FNP-C Vascular and Vein Specialists of [place] Office: [phone]  Clinic Physician: Edilia Bo  02/14/2017, 9:01 AM

## 2017-02-14 NOTE — Addendum Note (Signed)
Addended by: Burton ApleyPETTY, Jasmarie Coppock A on: 02/14/2017 04:26 PM   Modules accepted: Orders

## 2017-02-14 NOTE — Patient Instructions (Addendum)

## 2017-03-07 ENCOUNTER — Other Ambulatory Visit (HOSPITAL_COMMUNITY): Payer: BLUE CROSS/BLUE SHIELD

## 2017-03-07 ENCOUNTER — Ambulatory Visit: Payer: BLUE CROSS/BLUE SHIELD | Admitting: Family

## 2017-05-23 DIAGNOSIS — M542 Cervicalgia: Secondary | ICD-10-CM | POA: Insufficient documentation

## 2017-05-23 DIAGNOSIS — M5412 Radiculopathy, cervical region: Secondary | ICD-10-CM | POA: Insufficient documentation

## 2017-12-04 DIAGNOSIS — M5030 Other cervical disc degeneration, unspecified cervical region: Secondary | ICD-10-CM | POA: Diagnosis not present

## 2017-12-04 DIAGNOSIS — M5033 Other cervical disc degeneration, cervicothoracic region: Secondary | ICD-10-CM | POA: Diagnosis not present

## 2017-12-10 DIAGNOSIS — M7631 Iliotibial band syndrome, right leg: Secondary | ICD-10-CM | POA: Diagnosis not present

## 2017-12-10 DIAGNOSIS — M7918 Myalgia, other site: Secondary | ICD-10-CM | POA: Diagnosis not present

## 2017-12-10 DIAGNOSIS — M4692 Unspecified inflammatory spondylopathy, cervical region: Secondary | ICD-10-CM | POA: Diagnosis not present

## 2017-12-10 DIAGNOSIS — M542 Cervicalgia: Secondary | ICD-10-CM | POA: Diagnosis not present

## 2017-12-10 DIAGNOSIS — M5033 Other cervical disc degeneration, cervicothoracic region: Secondary | ICD-10-CM | POA: Diagnosis not present

## 2018-01-29 DIAGNOSIS — M50123 Cervical disc disorder at C6-C7 level with radiculopathy: Secondary | ICD-10-CM | POA: Diagnosis not present

## 2018-03-01 DIAGNOSIS — E785 Hyperlipidemia, unspecified: Secondary | ICD-10-CM | POA: Diagnosis not present

## 2018-03-01 DIAGNOSIS — Z01812 Encounter for preprocedural laboratory examination: Secondary | ICD-10-CM | POA: Diagnosis not present

## 2018-03-01 DIAGNOSIS — Z01810 Encounter for preprocedural cardiovascular examination: Secondary | ICD-10-CM | POA: Diagnosis not present

## 2018-03-01 DIAGNOSIS — M4802 Spinal stenosis, cervical region: Secondary | ICD-10-CM | POA: Diagnosis not present

## 2018-03-01 DIAGNOSIS — Z01818 Encounter for other preprocedural examination: Secondary | ICD-10-CM | POA: Diagnosis not present

## 2018-03-14 DIAGNOSIS — M50123 Cervical disc disorder at C6-C7 level with radiculopathy: Secondary | ICD-10-CM | POA: Diagnosis not present

## 2018-03-14 DIAGNOSIS — I82409 Acute embolism and thrombosis of unspecified deep veins of unspecified lower extremity: Secondary | ICD-10-CM | POA: Diagnosis not present

## 2018-03-14 DIAGNOSIS — E785 Hyperlipidemia, unspecified: Secondary | ICD-10-CM | POA: Diagnosis not present

## 2018-03-14 DIAGNOSIS — M4802 Spinal stenosis, cervical region: Secondary | ICD-10-CM | POA: Diagnosis not present

## 2018-04-08 DIAGNOSIS — M9902 Segmental and somatic dysfunction of thoracic region: Secondary | ICD-10-CM | POA: Diagnosis not present

## 2018-04-08 DIAGNOSIS — M9903 Segmental and somatic dysfunction of lumbar region: Secondary | ICD-10-CM | POA: Diagnosis not present

## 2018-04-08 DIAGNOSIS — M5136 Other intervertebral disc degeneration, lumbar region: Secondary | ICD-10-CM | POA: Diagnosis not present

## 2018-04-09 DIAGNOSIS — M9903 Segmental and somatic dysfunction of lumbar region: Secondary | ICD-10-CM | POA: Diagnosis not present

## 2018-04-09 DIAGNOSIS — M9902 Segmental and somatic dysfunction of thoracic region: Secondary | ICD-10-CM | POA: Diagnosis not present

## 2018-04-09 DIAGNOSIS — M5136 Other intervertebral disc degeneration, lumbar region: Secondary | ICD-10-CM | POA: Diagnosis not present

## 2018-04-10 DIAGNOSIS — M9902 Segmental and somatic dysfunction of thoracic region: Secondary | ICD-10-CM | POA: Diagnosis not present

## 2018-04-10 DIAGNOSIS — M5136 Other intervertebral disc degeneration, lumbar region: Secondary | ICD-10-CM | POA: Diagnosis not present

## 2018-04-10 DIAGNOSIS — M9903 Segmental and somatic dysfunction of lumbar region: Secondary | ICD-10-CM | POA: Diagnosis not present

## 2018-04-11 DIAGNOSIS — M5136 Other intervertebral disc degeneration, lumbar region: Secondary | ICD-10-CM | POA: Diagnosis not present

## 2018-04-11 DIAGNOSIS — M5033 Other cervical disc degeneration, cervicothoracic region: Secondary | ICD-10-CM | POA: Diagnosis not present

## 2018-04-11 DIAGNOSIS — M542 Cervicalgia: Secondary | ICD-10-CM | POA: Diagnosis not present

## 2018-04-11 DIAGNOSIS — M9902 Segmental and somatic dysfunction of thoracic region: Secondary | ICD-10-CM | POA: Diagnosis not present

## 2018-04-11 DIAGNOSIS — M9903 Segmental and somatic dysfunction of lumbar region: Secondary | ICD-10-CM | POA: Diagnosis not present

## 2018-04-11 DIAGNOSIS — M4692 Unspecified inflammatory spondylopathy, cervical region: Secondary | ICD-10-CM | POA: Diagnosis not present

## 2018-04-11 DIAGNOSIS — M7918 Myalgia, other site: Secondary | ICD-10-CM | POA: Diagnosis not present

## 2018-04-13 DIAGNOSIS — Z23 Encounter for immunization: Secondary | ICD-10-CM | POA: Diagnosis not present

## 2018-04-16 DIAGNOSIS — M5136 Other intervertebral disc degeneration, lumbar region: Secondary | ICD-10-CM | POA: Diagnosis not present

## 2018-04-16 DIAGNOSIS — M9902 Segmental and somatic dysfunction of thoracic region: Secondary | ICD-10-CM | POA: Diagnosis not present

## 2018-04-16 DIAGNOSIS — M9903 Segmental and somatic dysfunction of lumbar region: Secondary | ICD-10-CM | POA: Diagnosis not present

## 2018-04-19 DIAGNOSIS — M9902 Segmental and somatic dysfunction of thoracic region: Secondary | ICD-10-CM | POA: Diagnosis not present

## 2018-04-19 DIAGNOSIS — M9903 Segmental and somatic dysfunction of lumbar region: Secondary | ICD-10-CM | POA: Diagnosis not present

## 2018-04-19 DIAGNOSIS — M5136 Other intervertebral disc degeneration, lumbar region: Secondary | ICD-10-CM | POA: Diagnosis not present

## 2018-04-26 DIAGNOSIS — M5136 Other intervertebral disc degeneration, lumbar region: Secondary | ICD-10-CM | POA: Diagnosis not present

## 2018-04-26 DIAGNOSIS — M9902 Segmental and somatic dysfunction of thoracic region: Secondary | ICD-10-CM | POA: Diagnosis not present

## 2018-04-26 DIAGNOSIS — M9903 Segmental and somatic dysfunction of lumbar region: Secondary | ICD-10-CM | POA: Diagnosis not present

## 2018-04-29 DIAGNOSIS — M9902 Segmental and somatic dysfunction of thoracic region: Secondary | ICD-10-CM | POA: Diagnosis not present

## 2018-04-29 DIAGNOSIS — M5136 Other intervertebral disc degeneration, lumbar region: Secondary | ICD-10-CM | POA: Diagnosis not present

## 2018-04-29 DIAGNOSIS — M9903 Segmental and somatic dysfunction of lumbar region: Secondary | ICD-10-CM | POA: Diagnosis not present

## 2018-05-07 DIAGNOSIS — M9902 Segmental and somatic dysfunction of thoracic region: Secondary | ICD-10-CM | POA: Diagnosis not present

## 2018-05-08 DIAGNOSIS — M9902 Segmental and somatic dysfunction of thoracic region: Secondary | ICD-10-CM | POA: Diagnosis not present

## 2018-05-09 DIAGNOSIS — M436 Torticollis: Secondary | ICD-10-CM | POA: Diagnosis not present

## 2018-05-09 DIAGNOSIS — Z981 Arthrodesis status: Secondary | ICD-10-CM | POA: Diagnosis not present

## 2018-05-09 DIAGNOSIS — Z4789 Encounter for other orthopedic aftercare: Secondary | ICD-10-CM | POA: Diagnosis not present

## 2018-05-23 DIAGNOSIS — Z981 Arthrodesis status: Secondary | ICD-10-CM | POA: Diagnosis not present

## 2018-05-23 DIAGNOSIS — M436 Torticollis: Secondary | ICD-10-CM | POA: Diagnosis not present

## 2018-05-23 DIAGNOSIS — Z4789 Encounter for other orthopedic aftercare: Secondary | ICD-10-CM | POA: Diagnosis not present

## 2018-08-25 DIAGNOSIS — Z23 Encounter for immunization: Secondary | ICD-10-CM | POA: Diagnosis not present

## 2018-10-12 DIAGNOSIS — Z23 Encounter for immunization: Secondary | ICD-10-CM | POA: Diagnosis not present

## 2018-10-30 DIAGNOSIS — Z131 Encounter for screening for diabetes mellitus: Secondary | ICD-10-CM | POA: Diagnosis not present

## 2018-10-30 DIAGNOSIS — Z0000 Encounter for general adult medical examination without abnormal findings: Secondary | ICD-10-CM | POA: Diagnosis not present

## 2018-10-30 DIAGNOSIS — E7800 Pure hypercholesterolemia, unspecified: Secondary | ICD-10-CM | POA: Diagnosis not present

## 2018-10-30 DIAGNOSIS — Z125 Encounter for screening for malignant neoplasm of prostate: Secondary | ICD-10-CM | POA: Diagnosis not present

## 2018-12-31 DIAGNOSIS — M7711 Lateral epicondylitis, right elbow: Secondary | ICD-10-CM | POA: Diagnosis not present

## 2018-12-31 DIAGNOSIS — M5033 Other cervical disc degeneration, cervicothoracic region: Secondary | ICD-10-CM | POA: Diagnosis not present

## 2019-01-07 DIAGNOSIS — B349 Viral infection, unspecified: Secondary | ICD-10-CM | POA: Diagnosis not present

## 2019-01-08 DIAGNOSIS — R05 Cough: Secondary | ICD-10-CM | POA: Diagnosis not present

## 2019-01-08 DIAGNOSIS — L218 Other seborrheic dermatitis: Secondary | ICD-10-CM | POA: Diagnosis not present

## 2019-02-19 ENCOUNTER — Other Ambulatory Visit (HOSPITAL_COMMUNITY): Payer: BLUE CROSS/BLUE SHIELD

## 2019-02-19 ENCOUNTER — Ambulatory Visit: Payer: BLUE CROSS/BLUE SHIELD | Admitting: Family

## 2019-02-20 ENCOUNTER — Ambulatory Visit: Payer: BLUE CROSS/BLUE SHIELD | Admitting: Family

## 2019-02-20 ENCOUNTER — Other Ambulatory Visit (HOSPITAL_COMMUNITY): Payer: BLUE CROSS/BLUE SHIELD

## 2019-04-30 ENCOUNTER — Other Ambulatory Visit: Payer: Self-pay

## 2019-04-30 ENCOUNTER — Encounter: Payer: Self-pay | Admitting: Family

## 2019-04-30 ENCOUNTER — Ambulatory Visit (INDEPENDENT_AMBULATORY_CARE_PROVIDER_SITE_OTHER): Payer: Commercial Managed Care - PPO | Admitting: Family

## 2019-04-30 ENCOUNTER — Ambulatory Visit (HOSPITAL_COMMUNITY)
Admission: RE | Admit: 2019-04-30 | Discharge: 2019-04-30 | Disposition: A | Discharge status: Home / Self Care | Payer: Commercial Managed Care - PPO | Source: Ambulatory Visit | Attending: Family | Admitting: Family

## 2019-04-30 VITALS — BP 117/77 | HR 58 | Temp 97.4°F | Resp 20 | Ht 72.0 in | Wt 196.6 lb

## 2019-04-30 DIAGNOSIS — I77819 Aortic ectasia, unspecified site: Secondary | ICD-10-CM | POA: Insufficient documentation

## 2019-04-30 DIAGNOSIS — I77811 Abdominal aortic ectasia: Secondary | ICD-10-CM | POA: Diagnosis not present

## 2019-04-30 NOTE — Patient Instructions (Signed)
Before your next abdominal ultrasound:  Avoid gas forming foods and beverages the day before the test.   Take two Extra-Strength Gas-X capsules at bedtime the night before the test. Take another two Extra-Strength Gas-X capsules in the middle of the night if you get up to the restroom, if not, first thing in the morning with water.  Do not chew gum.     

## 2019-04-30 NOTE — Progress Notes (Signed)
[facility] [place]   CC: Follow up Abdominal Aortic Ectasia   History of Present Illness  Kenneth Adams is a 63 y.o. (1955/11/08) male whose father had an abdominal aortic aneurysm diagnosed in his 6450s. This reason an ultrasound screening study was recommended. He returns today for 2 year follow up.   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   At pt's 01-06-15 visit Dr. Edilia Boickson reviewed the records from Dr. Joycelyn RuaStephen Meyers office. The patient's father had an abdominal aortic aneurysm in his 3960s. He therefore underwent a screening test for aneurysmal disease. This showed that he had an infrarenal aorta that measured 2.8 cm in maximum diameter and both common iliac arteries measured 1.8 cm in maximum diameter. He was therefore sent for vascular consultation. He denies any history of abdominal pain or back pain. He is not a smoker. His blood pressure has been well controlled. He has normal renal function. At that visit Dr. Edilia Boickson did not consider the infrarenal aorta aneurysmal. Typically we would call this an aneurysm when the arteries 1-1/2 times normal which is approximately 3 cm. Dr. Edilia Boickson recommended a follow up ultrasound in 2 years. He was to see our nurse practitioner at that time. If there has been no change at that time we can stretch the follow up out even further.   He had C5-6 fusion done in [place] in January 2018 as an outpatient, Dr[facility] Or[facility]bell[facility], with good results.  He had DVT and PE after a back surgery in 2013. He had an adverse reaction to Xarelto. He takes an 81 mg ASA and Crestor.   He denies claudication type symptoms in his legs with walking. He denies any history of stroke or TIA symptoms.  Diabetic: No Tobacco use: non-smoker, he was exposed to secondhand smoke as a child, both parents smoked, mother died of lung cancer   Past  Medical History:  Diagnosis Date  . DVT (deep venous thrombosis) (HCC)   . Hyperlipidemia   . Pulmonary embolism (HCC) 2013   occurred after back surgyer   Past Surgical History:  Procedure Laterality Date  . BACK SURGERY    . NECK SURGERY  09/29/2016  . NO PAST SURGERIES     Social History Social History   Socioeconomic History  . Marital status: Married    Spouse name: Not on file  . Number of children: Not on file  . Years of education: Not on file  . Highest education level: Not on file  Occupational History  . Not on file  Social Needs  . Financial resource strain: Not on file  . Food insecurity    Worry: Not on file    Inability: Not on file  . Transportation needs    Medical: Not on file    Non-medical: Not on file  Tobacco Use  . Smoking status: Never Smoker  . Smokeless tobacco: Never Used  Substance and Sexual Activity  . Alcohol use: Yes    Alcohol/week: 5.0 standard drinks    Types: 3 Glasses of wine, 2 Shots of liquor per week    Comment: 2-3x weekly  . Drug use: No  . Sexual activity: Not on file  Lifestyle  . Physical activity    Days per week: Not on file    Minutes per session: Not on file  . Stress: Not on file  Relationships  .  Social Musicianconnections    Talks on phone: Not on file    Gets together: Not on file    Attends religious service: Not on file    Active member of club or organization: Not on file    Attends meetings of clubs or organizations: Not on file    Relationship status: Not on file  . Intimate partner violence    Fear of current or ex partner: Not on file    Emotionally abused: Not on file    Physically abused: Not on file    Forced sexual activity: Not on file  Other Topics Concern  . Not on file  Social History Narrative  . Not on file   Family History Family History  Problem Relation Age of Onset  . Cancer - Lung Mother   . Cancer Mother   . Hyperlipidemia Mother   . Hyperlipidemia Father     Current Outpatient  Medications on File Prior to Visit  Medication Sig Dispense Refill  . rosuvastatin (CRESTOR) 10 MG tablet      No current facility-administered medications on file prior to visit.    Allergies  Allergen Reactions  . Rivaroxaban Nausea Only    Other reaction(s): Other Extreme tiredness    ROS: See HPI for pertinent positives and negatives.  Physical Examination  Vitals:   04/30/19 0837  BP: 117/77  Pulse: (!) 58  Resp: 20  Temp: (!) 97.4 F (36.3 C)  SpO2: 98%  Weight: 196 lb 9.6 oz (89.2 kg)  Height: 6' (1.829 m)   Body mass index is 26.66 kg/m.  General: A&O x 3, WD, fit appearing male. HEENT: Grossly intact and WNL.  Pulmonary: Sym exp, respirations are non labored, good air movt, CTAB, no rales, rhonchi, or wheezing. Cardiac: Regular rhythm and rate, no detected murmur.  Carotid Bruits Right Left   Negative Negative   Adominal aortic pulse is not palpable Radial pulses are 2+ palpable                           VASCULAR EXAM:                                                                                                         LE Pulses Right Left       FEMORAL  3+ palpable  2+ palpable        POPLITEAL  2+ palpable   2+ palpable       POSTERIOR TIBIAL  2+ palpable   2+ palpable        DORSALIS PEDIS      ANTERIOR TIBIAL not palpable  not palpable    Gastrointestinal: soft, NTND, -G/R, - HSM, - masses palpated, - CVAT B. Musculoskeletal: M/S 5/5 throughout, Extremities without ischemic changes. Skin: No rashes, no ulcers, no cellulitis.   Neurologic: CN 2-12 intact, Pain and light touch intact in extremities are intact, Motor exam as listed above. Psychiatric: Normal thought content, mood appropriate to clinical situation.    DATA  AAA Duplex (  04/30/2019):  Previous size:  2.6 cm (Date: 02-14-17), Right CIA: not visualized; Left CIA: 1.41 cm  Right kidney cyst measuring 3.7 cm x 3.73 cm  Somewhat technically difficult exam due to bowel  gas  Current size:  Abdominal Aorta Findings: +-----------+-------+----------+----------+---------+--------+--------+ Location   AP (cm)Trans (cm)PSV (cm/s)Waveform ThrombusComments +-----------+-------+----------+----------+---------+--------+--------+ Proximal   2.79   2.69      106       triphasic                 +-----------+-------+----------+----------+---------+--------+--------+ Mid        2.52   2.46      96        triphasic                 +-----------+-------+----------+----------+---------+--------+--------+ Distal     2.14   2.52      86        triphasic                 +-----------+-------+----------+----------+---------+--------+--------+ RT CIA Prox1.5    1.6       110       triphasic                 +-----------+-------+----------+----------+---------+--------+--------+ LT CIA Prox1.6    1.6       119       triphasic                 +-----------+-------+----------+----------+---------+--------+--------+ Summary:  Abdominal Aorta: The largest aortic measurement is 2.8 cm. The largest aortic diameter remains essentially unchanged compared to prior exam. Previous diameter measurement was 2.6 cm obtained on 02/14/2017.    Medical Decision Making  The patient is a 63 y.o. male who presents with asymptomatic ectatic abdominal aorta with 2 mm increase in size in 2 years, to 2.8 cm today at proximal segment, from 2.6 cm in 2018.  Right kidney cyst measuring 3.7 cm x 3.73 cm at his 2018 duplex; pt states he is aware of a kidney cyst; I advised him at that time to ask his PCP about monitoring of this and he has.   Based on this patient's exam and diagnostic studies, the patient will follow up in 2 years with the following studies: AAA duplex.  Consideration for repair of AAA would be made when the size is 5.5cm, growth > 1 cm/yr, and symptomatic status.  Abdominal aortic aneurysm less than 5-1/2 cm in diameter has less then 1/2%  risk of rupture per year.        The patient was given information about AAA including signs, symptoms, treatment, and how to minimize the risk of enlargement and rupture of aneurysms.    I emphasized the importance of maximal medical management including strict control of blood pressure, blood glucose, and lipid levels, antiplatelet agents, obtaining regular exercise, and continued cessation of smoking.   The patient was advised to call 911 should the patient experience sudden onset abdominal or back pain.   Thank you for allowing [place] to participate in this patient's care.  Clemon Chambers, RN, MSN, FNP-C [facility] [place] Office: [phone]  Clinic Physician: Scot Dock  04/30/2019, 8:42 AM

## 2019-12-23 ENCOUNTER — Other Ambulatory Visit: Payer: Self-pay | Admitting: *Deleted

## 2019-12-23 DIAGNOSIS — R0989 Other specified symptoms and signs involving the circulatory and respiratory systems: Secondary | ICD-10-CM

## 2019-12-24 ENCOUNTER — Ambulatory Visit (INDEPENDENT_AMBULATORY_CARE_PROVIDER_SITE_OTHER): Payer: Commercial Managed Care - PPO | Admitting: Vascular Surgery

## 2019-12-24 ENCOUNTER — Other Ambulatory Visit: Payer: Self-pay

## 2019-12-24 ENCOUNTER — Ambulatory Visit (HOSPITAL_COMMUNITY)
Admission: RE | Admit: 2019-12-24 | Discharge: 2019-12-24 | Disposition: A | Discharge status: Home / Self Care | Payer: Commercial Managed Care - PPO | Source: Ambulatory Visit | Attending: Vascular Surgery | Admitting: Vascular Surgery

## 2019-12-24 ENCOUNTER — Encounter: Payer: Self-pay | Admitting: Vascular Surgery

## 2019-12-24 VITALS — BP 125/77 | HR 60 | Temp 97.6°F | Resp 20 | Ht 72.0 in | Wt 192.0 lb

## 2019-12-24 DIAGNOSIS — R0989 Other specified symptoms and signs involving the circulatory and respiratory systems: Secondary | ICD-10-CM

## 2019-12-24 DIAGNOSIS — I739 Peripheral vascular disease, unspecified: Secondary | ICD-10-CM | POA: Diagnosis not present

## 2019-12-24 NOTE — Progress Notes (Signed)
REASON FOR CONSULT:    Peripheral vascular disease.  The consult is requested by Mitzi Hansen, FNP.  ASSESSMENT & PLAN:   COLD FEET WITH DIMINISHED PULSES: The patient has palpable posterior tibial pulses bilaterally.  I cannot palpate dorsalis pedis pulses, however his noninvasive studies are completely normal with biphasic dorsalis pedis signals and an ABI of 100% bilaterally.  Toe pressures are normal also.  Based on his exam and Doppler study I think he has no evidence of significant underlying peripheral vascular disease.  His only risk factor for peripheral vascular disease is hypercholesterolemia.  I encouraged him to stay as active as possible.  We have discussed the importance of nutrition.  I will be happy to see him back at any time if any new vascular issues arise.  Waverly Ferrari, MD Office: [phone]   HPI:   Kenneth Adams is a pleasant 64 y.o. male, who was referred for evaluation of peripheral vascular disease.  I have reviewed the records from the referring office.  The patient was seen on 11/04/2019 complaining of cold feet.  Patient's brother and had issues with poor circulation and required stents.  He was concerned about cold feet and comes in for evaluation for peripheral vascular disease.  On exam he was noted to have decreased pulses in his feet.  My history, the patient denies any history of claudication, rest pain, or nonhealing ulcers.  He did have a right lower extremity DVT 9 years ago with bilateral PEs.  He was on Coumadin for a while.  He underwent a hypercoagulable work-up which was unremarkable.  He has had no problems since then.  This DVT in the PEs occurred after back surgery.  This was in [place].  His risk factors for peripheral vascular disease or hypercholesterolemia.  He denies any history of diabetes, hypertension, family history of premature cardiovascular disease or history of tobacco use.  Past Medical History:  Diagnosis Date  . DVT  (deep venous thrombosis) (HCC)   . Hyperlipidemia   . Pulmonary embolism (HCC) 2013   occurred after back surgyer    Family History  Problem Relation Age of Onset  . Cancer - Lung Mother   . Cancer Mother   . Hyperlipidemia Mother   . Hyperlipidemia Father     SOCIAL HISTORY: Social History   Socioeconomic History  . Marital status: Married    Spouse name: Not on file  . Number of children: Not on file  . Years of education: Not on file  . Highest education level: Not on file  Occupational History  . Not on file  Tobacco Use  . Smoking status: Never Smoker  . Smokeless tobacco: Never Used  Substance and Sexual Activity  . Alcohol use: Yes    Alcohol/week: 5.0 standard drinks    Types: 3 Glasses of wine, 2 Shots of liquor per week    Comment: 2-3x weekly  . Drug use: No  . Sexual activity: Not on file  Other Topics Concern  . Not on file  Social History Narrative  . Not on file   Social Determinants of Health   Financial Resource Strain:   . Difficulty of Paying Living Expenses:   Food Insecurity:   . Worried About Programme researcher, broadcasting/film/video in the Last Year:   . Barista in the Last Year:   Transportation Needs:   . Freight forwarder (Medical):   Marland Kitchen Lack of Transportation (Non-Medical):   Physical Activity:   .  Days of Exercise per Week:   . Minutes of Exercise per Session:   Stress:   . Feeling of Stress :   Social Connections:   . Frequency of Communication with Friends and Family:   . Frequency of Social Gatherings with Friends and Family:   . Attends Religious Services:   . Active Member of Clubs or Organizations:   . Attends Banker Meetings:   Marland Kitchen Marital Status:   Intimate Partner Violence:   . Fear of Current or Ex-Partner:   . Emotionally Abused:   Marland Kitchen Physically Abused:   . Sexually Abused:     Allergies  Allergen Reactions  . Rivaroxaban Nausea Only    Other reaction(s): Other Extreme tiredness    Current Outpatient  Medications  Medication Sig Dispense Refill  . clobetasol (TEMOVATE) 0.05 % external solution APPLY TO SCALP EVERY DAY    . ketoconazole (NIZORAL) 2 % shampoo SHAMPOO ONCE DAILY    . montelukast (SINGULAIR) 10 MG tablet Take 10 mg by mouth daily.    . rosuvastatin (CRESTOR) 20 MG tablet Take 20 mg by mouth daily.     No current facility-administered medications for this visit.    REVIEW OF SYSTEMS:  [X]  denotes positive finding, [ ]  denotes negative finding Cardiac  Comments:  Chest pain or chest pressure:    Shortness of breath upon exertion:    Short of breath when lying flat:    Irregular heart rhythm:        Vascular    Pain in calf, thigh, or hip brought on by ambulation:    Pain in feet at night that wakes you up from your sleep:     Blood clot in your veins:    Leg swelling:         Pulmonary    Oxygen at home:    Productive cough:     Wheezing:         Neurologic    Sudden weakness in arms or legs:     Sudden numbness in arms or legs:     Sudden onset of difficulty speaking or slurred speech:    Temporary loss of vision in one eye:     Problems with dizziness:         Gastrointestinal    Blood in stool:     Vomited blood:         Genitourinary    Burning when urinating:     Blood in urine:        Psychiatric    Major depression:         Hematologic    Bleeding problems:    Problems with blood clotting too easily:        Skin    Rashes or ulcers:        Constitutional    Fever or chills:     PHYSICAL EXAM:   Vitals:   12/24/19 1243  BP: 125/77  Pulse: 60  Resp: 20  Temp: 97.6 F (36.4 C)  SpO2: 98%  Weight: 192 lb (87.1 kg)  Height: 6' (1.829 m)    GENERAL: The patient is a well-nourished male, in no acute distress. The vital signs are documented above. CARDIAC: There is a regular rate and rhythm.  VASCULAR: I do not detect carotid bruits. He has palpable femoral, popliteal, and posterior tibial pulses bilaterally. I cannot palpate  dorsalis pedis pulses. Both feet are warm and well perfused. He has no significant lower extremity swelling. PULMONARY:  There is good air exchange bilaterally without wheezing or rales. ABDOMEN: Soft and non-tender with normal pitched bowel sounds.  MUSCULOSKELETAL: There are no major deformities or cyanosis. NEUROLOGIC: No focal weakness or paresthesias are detected. SKIN: There are no ulcers or rashes noted. PSYCHIATRIC: The patient has a normal affect.  DATA:    ARTERIAL DOPPLER: I have independently interpreted his arterial Doppler study today.  On the right side there is a triphasic posterior tibial signal with a biphasic dorsalis pedis signal.  ABI is 100%.  Toe pressures 118 mmHg.  On the left side there is a triphasic posterior tibial signal and a biphasic dorsalis pedis signal.  ABI is 100%.  Toe pressures 137 mmHg.

## 2021-05-20 ENCOUNTER — Other Ambulatory Visit: Payer: Self-pay

## 2021-05-20 DIAGNOSIS — I714 Abdominal aortic aneurysm, without rupture, unspecified: Secondary | ICD-10-CM

## 2021-05-26 ENCOUNTER — Ambulatory Visit: Payer: Commercial Managed Care - PPO

## 2021-05-31 ENCOUNTER — Ambulatory Visit: Payer: Commercial Managed Care - PPO | Attending: Otolaryngology

## 2021-05-31 ENCOUNTER — Other Ambulatory Visit: Payer: Self-pay

## 2021-05-31 DIAGNOSIS — R2681 Unsteadiness on feet: Secondary | ICD-10-CM | POA: Diagnosis present

## 2021-05-31 DIAGNOSIS — R42 Dizziness and giddiness: Secondary | ICD-10-CM

## 2021-05-31 DIAGNOSIS — H8112 Benign paroxysmal vertigo, left ear: Secondary | ICD-10-CM | POA: Insufficient documentation

## 2021-05-31 NOTE — Therapy (Signed)
[facility] [address] [place], [place], [address] Phone: [phone]   Fax:  [phone]  Physical Therapy Evaluation  Patient Details  Name: Kenneth Adams MRN: [id number] Date of Birth: 1956/04/03 Referring Provider (PT): Newman Pies, MD   Encounter Date: 05/31/2021   PT End of Session - 05/31/21 1856     Visit Number 1    Number of Visits 9    Date for PT Re-Evaluation 07/01/21    Authorization Type [employer]    PT Start Time 1700    PT Stop Time 1735    PT Time Calculation (min) 35 min    Activity Tolerance Patient tolerated treatment well    Behavior During Therapy [facility] for tasks assessed/performed             Past Medical History:  Diagnosis Date   DVT (deep venous thrombosis) (HCC)    Hyperlipidemia    Pulmonary embolism (HCC) 2013   occurred after back surgyer    Past Surgical History:  Procedure Laterality Date   BACK SURGERY     NECK SURGERY  09/29/2016   NO PAST SURGERIES      There were no vitals filed for this visit.    Subjective Assessment - 05/31/21 1702     Subjective Patient reports the dizziness started in January when he was flying. Reports spinning vertigo that was intense with associated nausea.. Patient was previously seen for dizziness at ENT due to BPPV. This provided some relief, however continues to experience frequent sensation of off balance and lightheaded sesnsation. Reports occasional spinning sensation with rolling over in bed, very brief. Reports that this occured last night. Denies vision/hearing changes. Reports mild nausea intermittently, mainly when having the spinning sensations. No falls. 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 Denies any sudden changes in strength/sensation    Pertinent History DVT, HLD, PE, Back Surgery, Neck Surgery, AAA    Limitations Walking;Standing    Patient Stated Goals Get back to flying status     Currently in Pain? No/denies                [facility] PT Assessment - 05/31/21 0001       Assessment   Medical Diagnosis Dizziness    Referring Provider (PT) Newman Pies, MD    Onset Date/Surgical Date --   First Episode january 3rd, 2022   Prior Therapy No Vestibular PT prior      Precautions   Precautions None      Balance Screen   Has the patient fallen in the past 6 months No    Has the patient had a decrease in activity level because of a fear of falling?  No    Is the patient reluctant to leave their home because of a fear of falling?  Yes      Home Environment   Living Environment Private residence    Type of Home House    Additional Comments Denies issues getting in/around home due to dizziness      Prior Function   Level of Independence Independent    Vocation Full time employment    Glass blower/designer      Cognition   Overall Cognitive Status Within Functional Limits for tasks assessed      Observation/Other Assessments   Focus on Therapeutic Outcomes (FOTO)  DPS: 52% DFS: 51.3%      Sensation   Light Touch  Appears Intact      ROM / Strength   AROM / PROM / Strength Strength      Strength   Overall Strength Within functional limits for tasks performed      Transfers   Transfers Sit to Stand;Stand to Sit    Sit to Stand 7: Independent    Stand to Sit 7: Independent      Ambulation/Gait   Ambulation/Gait Yes    Ambulation/Gait Assistance 7: Independent    Ambulation Distance (Feet) 50 Feet    Assistive device None    Gait Pattern Within Functional Limits    Ambulation Surface Level;Indoor    Gait Comments No significant imbalance noted throughout evaluation                Vestibular Assessment - 05/31/21 0001       Symptom Behavior   Subjective history of current problem see subjective    Type of Dizziness  Imbalance;Spinning;Lightheadedness    Frequency of Dizziness daily    Duration of Dizziness seconds    Symptom  Nature Motion provoked    Aggravating Factors Rolling to right;Rolling to left;Looking up to the ceiling;Sitting with head tilted back;Forward bending    Relieving Factors Rest;Slow movements    Progression of Symptoms --   fluctuates     Oculomotor Exam   Oculomotor Alignment Normal    Ocular ROM WNL    Spontaneous Absent    Gaze-induced  Absent    Smooth Pursuits Intact    Saccades Intact      Oculomotor Exam-Fixation Suppressed    Left Head Impulse Negative    Right Head Impulse Positive      Vestibulo-Ocular Reflex   VOR 1 Head Only (x 1 viewing) No Dizziness    VOR Cancellation Normal      Visual Acuity   Static 10    Dynamic 9      Positional Testing   Dix-Hallpike Dix-Hallpike Right;Dix-Hallpike Left    Horizontal Canal Testing Horizontal Canal Right;Horizontal Canal Left      Dix-Hallpike Right   Dix-Hallpike Right Duration 0    Dix-Hallpike Right Symptoms No nystagmus      Dix-Hallpike Left   Dix-Hallpike Left Duration 0    Dix-Hallpike Left Symptoms No nystagmus      Horizontal Canal Right   Horizontal Canal Right Duration 35 seconds    Horizontal Canal Right Symptoms Ageotrophic   very mild     Horizontal Canal Left   Horizontal Canal Left Duration 0    Horizontal Canal Left Symptoms Normal   no nystagmus noted     Positional Sensitivities   Right Hallpike No dizziness    Up from Right Hallpike No dizziness    Up from Left Hallpike No dizziness    Rolling Right Mild dizziness    Rolling Left No dizziness                Objective measurements completed on examination: See above findings.        Vestibular Treatment/Exercise - 05/31/21 0001       Vestibular Treatment/Exercise   Vestibular Treatment Provided Canalith Repositioning    Canalith Repositioning Appiani Left      Appiani Left   Number of Reps  1    Overall Response  Symptoms Resolved    Response Details Added in vibration to mastoid during manuever; With reassesment no  nystagmus noted.  Upper Extremity Functional Index Score:  /80   PT Education - 05/31/21 1856     Education Details Educated on POC/Evaluation Findings; BPPV. Trial sleeping on back/R side for tonight    Person(s) Educated Patient    Methods Explanation    Comprehension Verbalized understanding              PT Short Term Goals - 05/31/21 1858       PT SHORT TERM GOAL #1   Title = LTGs               PT Long Term Goals - 05/31/21 1859       PT LONG TERM GOAL #1   Title Patient will be independent with initial vestibular/balance HEP (All LTGs Due: 07/01/21)    Baseline no HEP Established    Time 4    Period Weeks    Status New    Target Date 07/01/21      PT LONG TERM GOAL #2   Title Patient will demonstrate (-) positional testing to indicate resolution of BPPV    Baseline L Horizontal Canal    Time 4    Period Weeks    Status New      PT LONG TERM GOAL #3   Title LTG to be set for FGA as applicable    Baseline TBA    Time 6    Period Weeks    Status New      PT LONG TERM GOAL #4   Title Patient will improve DPS >/= 60%    Baseline 52%    Time 4    Period Weeks    Status New                    Plan - 05/31/21 1905     Clinical Impression Statement Patient is a 65 y.o. male referred to Neuro OPPT services for Dizziness. Patient's PMH significant for the following:  DVT, HLD, PE, Back Surgery, Neck Surgery, AAA. Upon evaluation patient presetns with the following impairments: dizziness, positive R HIT indicating impaired VOR, motion senstivity. Negative dix hallpike testing, however with horizontal canal testing patient indicating ageotrophic nystagmus with R roll test indicating potential L horizontal canal canalithasis. Completed appiani treatment x 1 rep with no nystagmus noted on reassesment. Patient will benefit from skilled PT services to address impairments, resolve vertigo to allow for return to occupation and  PLOF.    Personal Factors and Comorbidities Comorbidity 3+    Comorbidities DVT, HLD, PE, Back Surgery, Neck Surgery,AAA    Examination-Activity Limitations Bed Mobility;Bend;Reach Overhead    Examination-Participation Restrictions Occupation    Stability/Clinical Decision Making Stable/Uncomplicated    Clinical Decision Making Low    Rehab Potential Good    PT Frequency 2x / week    PT Duration 4 weeks    PT Treatment/Interventions Canalith Repostioning;ADLs/Self Care Home Management;Cryotherapy;Iontophoresis 4mg /ml Dexamethasone;Moist Heat;Gait training;Stair training;Functional mobility training;Therapeutic activities;Balance training;Therapeutic exercise;Neuromuscular re-education;Patient/family education;Dry needling;Vestibular;Passive range of motion;Manual techniques;Joint Manipulations    PT Next Visit Plan Reassess BPPV, treat as indicated. Assess FGA. Initaite VOR + Balance HEp    Consulted and Agree with Plan of Care Patient             Patient will benefit from skilled therapeutic intervention in order to improve the following deficits and impairments:  Decreased balance, Decreased activity tolerance, Dizziness  Visit Diagnosis: Dizziness and giddiness  BPPV (benign paroxysmal positional vertigo), left  Unsteadiness on feet  Problem List Patient Active Problem List   Diagnosis Date Noted   Acute neck pain 05/23/2017   Cervical radiculopathy 05/23/2017   S/P cervical spinal fusion 01/03/2017   Cervical disc disorder with radiculopathy 09/21/2016   SOB (shortness of breath) 08/24/2014   Dyslipidemia 08/24/2014   Pulmonary embolism (HCC)     Tempie Donning, PT, DPT 06/01/2021, 7:24 AM  [facility] [address] [place], [place], [address] Phone: [phone]   Fax:  [phone]  Name: Hulen Mandler MRN: [id number] Date of Birth: [birth year]

## 2021-06-01 ENCOUNTER — Ambulatory Visit (HOSPITAL_COMMUNITY)
Admission: RE | Admit: 2021-06-01 | Discharge: 2021-06-01 | Disposition: A | Discharge status: Home / Self Care | Payer: Commercial Managed Care - PPO | Source: Ambulatory Visit | Attending: Vascular Surgery | Admitting: Vascular Surgery

## 2021-06-01 ENCOUNTER — Ambulatory Visit: Payer: Commercial Managed Care - PPO | Admitting: Physician Assistant

## 2021-06-01 VITALS — BP 135/74 | HR 51 | Temp 98.6°F | Resp 20 | Ht 72.0 in | Wt 197.4 lb

## 2021-06-01 DIAGNOSIS — I714 Abdominal aortic aneurysm, without rupture, unspecified: Secondary | ICD-10-CM

## 2021-06-01 NOTE — Progress Notes (Addendum)
[facility] [place] HISTORY AND PHYSICAL   History of Present Illness:  Patient is a 65 y.o. year old male who presents for evaluation of abdominal aortic aneurysm and PAD.   Based on his noninvasive studies on 12/24/2019 he had completely normal with biphasic dorsalis pedis signals and an ABI of 100% bilaterally.  Toe pressures are normal also. He denise symptoms of claudication, rest pain or non healing wounds.  He has a brother that has sever symptoms and he wants to be preventative.    He was originally seen by DR. Edilia Bo for AAA.   he had an infrarenal aorta that measured 2.8 cm in maximum diameter and both common iliac arteries measured 1.8 cm in maximum diameter.   This has been stable without symptoms of abdominal or lumbar pain.    He does walk occasionally for exercise.  He does not smoke and he maintains a healthy weight.    My history, the patient denies any history of claudication, rest pain, or nonhealing ulcers.   He did have a right lower extremity DVT 9 years ago with bilateral PEs.  He was on Coumadin for a while.  He underwent a hypercoagulable work-up which was unremarkable.  He has had no problems since then.  This DVT in the PEs occurred after back surgery.  This was in [place].   He has an allergy to Rivaroxaban after his PE's/DVT.  He continues to take a daily Statin.    Past Medical History:  Diagnosis Date   DVT (deep venous thrombosis) (HCC)    Hyperlipidemia    Pulmonary embolism (HCC) 2013   occurred after back surgyer    Past Surgical History:  Procedure Laterality Date   BACK SURGERY     NECK SURGERY  09/29/2016   NO PAST SURGERIES       Social History Social History   Tobacco Use   Smoking status: Never   Smokeless tobacco: Never  Vaping Use   Vaping Use: Never used  Substance Use Topics   Alcohol use: Yes    Alcohol/week: 5.0 standard drinks    Types: 3 Glasses of wine, 2 Shots of liquor per week    Comment: 2-3x weekly    Drug use: No    Family History Family History  Problem Relation Age of Onset   Cancer - Lung Mother    Cancer Mother    Hyperlipidemia Mother    Hyperlipidemia Father     Allergies  Allergies  Allergen Reactions   Rivaroxaban Nausea Only    Other reaction(s): Other Extreme tiredness     Current Outpatient Medications  Medication Sig Dispense Refill   clobetasol (TEMOVATE) 0.05 % external solution APPLY TO SCALP EVERY DAY     ketoconazole (NIZORAL) 2 % shampoo SHAMPOO ONCE DAILY     montelukast (SINGULAIR) 10 MG tablet Take 10 mg by mouth daily.     rosuvastatin (CRESTOR) 20 MG tablet Take 20 mg by mouth daily.     No current facility-administered medications for this visit.    ROS:   General:  No weight loss, Fever, chills  HEENT: No recent headaches, no nasal bleeding, no visual changes, no sore throat  Neurologic: No dizziness, blackouts, seizures. No recent symptoms of stroke or mini- stroke. No recent episodes of slurred speech, or temporary blindness.  Cardiac: No recent episodes of chest pain/pressure, no shortness of breath at rest.  No shortness of breath with exertion.  Denies history of atrial fibrillation  or irregular heartbeat  Vascular: No history of rest pain in feet.  No history of claudication.  No history of non-healing ulcer, No history of DVT   Pulmonary: No home oxygen, no productive cough, no hemoptysis,  No asthma or wheezing  Musculoskeletal:  [ ]  Arthritis, [ ]  Low back pain,  [ ]  Joint pain  Hematologic:No history of hypercoagulable state.  No history of easy bleeding.  No history of anemia  Gastrointestinal: No hematochezia or melena,  No gastroesophageal reflux, no trouble swallowing  Urinary: [ ]  chronic Kidney disease, [ ]  on HD - [ ]  MWF or [ ]  TTHS, [ ]  Burning with urination, [ ]  Frequent urination, [ ]  Difficulty urinating;   Skin: No rashes  Psychological: No history of anxiety,  No history of depression   Physical  Examination  Vitals:   06/01/21 0833  BP: 135/74  Pulse: (!) 51  Resp: 20  Temp: 98.6 F (37 C)  TempSrc: Temporal  SpO2: 98%  Weight: 197 lb 6.4 oz (89.5 kg)  Height: 6' (1.829 m)    Body mass index is 26.77 kg/m.  General:  Alert and oriented, no acute distress HEENT: Normal Neck: No bruit or JVD Pulmonary: Clear to auscultation bilaterally Cardiac: Regular Rate and Rhythm without murmur Gastrointestinal: Soft, non-tender, non-distended, no mass, no scars Skin: No rash Extremity Pulses:  2+ radial, brachial, femoral,  posterior tibial pulses bilaterally Musculoskeletal: No deformity or edema  Neurologic: Upper and lower extremity motor 5/5 and symmetric  DATA:  Abdominal Aorta Findings:  +-----------+-------+----------+----------+--------+--------+--------+  Location   AP (cm)Trans (cm)PSV (cm/s)WaveformThrombusComments  +-----------+-------+----------+----------+--------+--------+--------+  Proximal   1.97   2.12      96                                  +-----------+-------+----------+----------+--------+--------+--------+  Mid        2.09   2.17      60                                  +-----------+-------+----------+----------+--------+--------+--------+  Distal     1.91   2.22      79                                  +-----------+-------+----------+----------+--------+--------+--------+  RT CIA Prox1.2    1.7       85                                  +-----------+-------+----------+----------+--------+--------+--------+  LT CIA Prox1.3    1.5       87                                  +-----------+-------+----------+----------+--------+--------+--------+   Summary:  Abdominal Aorta: Ectatic distal aorta. Largest measurement 2.22 cm.      ASSESSMENT/Plan: AAA on surveillance studies asymptomatic The largest diameter was 2.2 cm in the distal aorta which is considered WNL< 3.0 cm. Right  CIA 1.7 cm Left CIA 1.5 cm.   The right CIA is slightly above the normal of 1.5 cm.  We will continue to follow this with repeat duplex.  He denise symptoms of abdominal or lumbar pain, claudication, non healing wounds or rest pain.  He should continue to work towards a walking program.  If he develops symptoms listed above he will call.  Other wise he will return for AAA duplex and exam in 2 years.     Mosetta Pigeon PA-C [facility] [place] Office: [phone]  MD in clinic [place]

## 2021-06-06 ENCOUNTER — Other Ambulatory Visit: Payer: Self-pay

## 2021-06-06 ENCOUNTER — Ambulatory Visit: Payer: Commercial Managed Care - PPO

## 2021-06-06 DIAGNOSIS — R42 Dizziness and giddiness: Secondary | ICD-10-CM | POA: Diagnosis not present

## 2021-06-06 DIAGNOSIS — R2681 Unsteadiness on feet: Secondary | ICD-10-CM

## 2021-06-06 DIAGNOSIS — H8112 Benign paroxysmal vertigo, left ear: Secondary | ICD-10-CM

## 2021-06-06 NOTE — Patient Instructions (Addendum)
Gaze Stabilization: Sitting    Keeping eyes on target on wall 3-4 feet away, tilt head down 15-30 and move head side to side for 60 seconds. Repeat while moving head up and down for 60 seconds. Do 2-3 sessions per day.   Gaze Stabilization: Tip Card  1.Target must remain in focus, not blurry, and appear stationary while head is in motion. 2.Perform exercises with small head movements (45 to either side of midline). 3.Increase speed of head motion so long as target is in focus. 4.If you wear eyeglasses, be sure you can see target through lens (therapist will give specific instructions for bifocal / progressive lenses). 5.These exercises may provoke dizziness or nausea. Work through these symptoms. If too dizzy, slow head movement slightly. Rest between each exercise. 6.Exercises demand concentration; avoid distractions. 7.For safety, perform standing exercises close to a counter, wall, corner, or next to someone.    Forward Progression With 180 (Half) Turns    Walk making a slow half turn in place, leading with head and eyes, toward right every 3-4 steps. Walk in a straight path forward between turns. Repeat sequence 5 times per session. Do 1 sessions per day.  Copyright  VHI. All rights reserved.   Walking Head Turn    Standing close to a wall, walk 40-50 feet while turning head side to side, then up and down Touch wall if necessary to keep balance. Repeat 3-4 times. Do 1 sessions per day.  Copyright  VHI. All rights reserved.

## 2021-06-06 NOTE — Therapy (Signed)
[facility] [address] [place], [place], [address] Phone: [phone]   Fax:  [phone]  Physical Therapy Treatment  Patient Details  Name: Evo Aderman MRN: [id number] Date of Birth: 05-24-1956 Referring Provider (PT): Newman Pies, MD   Encounter Date: 06/06/2021   PT End of Session - 06/06/21 0933     Visit Number 2    Number of Visits 9    Date for PT Re-Evaluation 07/01/21    Authorization Type UHC    PT Start Time 0931    PT Stop Time 1010    PT Time Calculation (min) 39 min    Activity Tolerance Patient tolerated treatment well    Behavior During Therapy [facility] for tasks assessed/performed             Past Medical History:  Diagnosis Date   DVT (deep venous thrombosis) (HCC)    Hyperlipidemia    Pulmonary embolism (HCC) 2013   occurred after back surgyer    Past Surgical History:  Procedure Laterality Date   BACK SURGERY     NECK SURGERY  09/29/2016   NO PAST SURGERIES      There were no vitals filed for this visit.   Subjective Assessment - 06/06/21 0934     Subjective Patient reports that he felt weird the day of last treatment, then over next few days felt very good. Still have sensation of off balance especially with dark room and rolling.    Pertinent History DVT, HLD, PE, Back Surgery, Neck Surgery, AAA    Limitations Walking;Standing    Patient Stated Goals Get back to flying status    Currently in Pain? No/denies                [facility] PT Assessment - 06/06/21 0001       Functional Gait  Assessment   Gait assessed  Yes    Gait Level Surface Walks 20 ft in less than 5.5 sec, no assistive devices, good speed, no evidence for imbalance, normal gait pattern, deviates no more than 6 in outside of the 12 in walkway width.    Change in Gait Speed Able to smoothly change walking speed without loss of balance or gait deviation. Deviate no more than 6 in outside of the 12 in walkway  width.    Gait with Horizontal Head Turns Performs head turns smoothly with no change in gait. Deviates no more than 6 in outside 12 in walkway width    Gait with Vertical Head Turns Performs head turns with no change in gait. Deviates no more than 6 in outside 12 in walkway width.   mild dizziness   Gait and Pivot Turn Pivot turns safely within 3 sec and stops quickly with no loss of balance.   mild dizziness   Step Over Obstacle Is able to step over 2 stacked shoe boxes taped together (9 in total height) without changing gait speed. No evidence of imbalance.    Gait with Narrow Base of Support Is able to ambulate for 10 steps heel to toe with no staggering.    Gait with Eyes Closed Walks 20 ft, no assistive devices, good speed, no evidence of imbalance, normal gait pattern, deviates no more than 6 in outside 12 in walkway width. Ambulates 20 ft in less than 7 sec.    Ambulating Backwards Walks 20 ft, no assistive devices, good speed, no evidence for imbalance, normal gait    Steps Alternating feet, no rail.  Total Score 30              Vestibular Assessment - 06/06/21 0001       Positional Testing   Horizontal Canal Testing Horizontal Canal Right;Horizontal Canal Left      Horizontal Canal Right   Horizontal Canal Right Duration 0    Horizontal Canal Right Symptoms Normal      Horizontal Canal Left   Horizontal Canal Left Duration 0    Horizontal Canal Left Symptoms Normal      Positional Sensitivities   Rolling Right No dizziness    Rolling Left No dizziness              [facility] Adult PT Treatment/Exercise - 06/06/21 0001       Ambulation/Gait   Ambulation/Gait Yes    Ambulation/Gait Assistance 7: Independent    Assistive device None    Gait Pattern Within Functional Limits    Ambulation Surface Level;Indoor    Gait Comments throughout therapy gym with activities      High Level Balance   High Level Balance Activities Head turns    High Level Balance Comments  Completed ambulation with horizontal/vertical head turns 2 x 50'. Mild reports of imbalance but no significantbalance deficits noted and no  dizziness.      Neuro Re-ed    Neuro Re-ed Details  Completed M-CTSIB: situation 1-4 for full 30 seconds, mild postural sway with vision removed.             Vestibular Treatment/Exercise - 06/06/21 0001       Vestibular Treatment/Exercise   Vestibular Treatment Provided Gaze    Habituation Exercises 180 degree Turns;360 degree Turns    Gaze Exercises X1 Viewing Horizontal;X1 Viewing Vertical      180 degree Turns   Number of Reps  6    Symptom Description  mild dizziness      360 degree Turns   Number of Reps  2    COMMENT mild imbalance      X1 Viewing Horizontal   Foot Position seated    Reps 2    Comments x 30 seconds; x 60 seconds. no dizziness      X1 Viewing Vertical   Foot Position seated    Reps 2    Comments x 30 seconds, x 60 seconds.            Gaze Stabilization: Sitting    Keeping eyes on target on wall 3-4 feet away, tilt head down 15-30 and move head side to side for 60 seconds. Repeat while moving head up and down for 60 seconds. Do 2-3 sessions per day.   Gaze Stabilization: Tip Card  1.Target must remain in focus, not blurry, and appear stationary while head is in motion. 2.Perform exercises with small head movements (45 to either side of midline). 3.Increase speed of head motion so long as target is in focus. 4.If you wear eyeglasses, be sure you can see target through lens (therapist will give specific instructions for bifocal / progressive lenses). 5.These exercises may provoke dizziness or nausea. Work through these symptoms. If too dizzy, slow head movement slightly. Rest between each exercise. 6.Exercises demand concentration; avoid distractions. 7.For safety, perform standing exercises close to a counter, wall, corner, or next to someone.    Forward Progression With 180 (Half)  Turns    Walk making a slow half turn in place, leading with head and eyes, toward right every 3-4 steps. Walk in a straight path  forward between turns. Repeat sequence 5 times per session. Do 1 sessions per day.  Copyright  VHI. All rights reserved.   Walking Head Turn    Standing close to a wall, walk 40-50 feet while turning head side to side, then up and down Touch wall if necessary to keep balance. Repeat 3-4 times. Do 1 sessions per day.  Copyright  VHI. All rights reserved.         PT Education - 06/06/21 1012     Education Details FGA Results; Resolution of BPPV. Initial HEP    Person(s) Educated Patient    Methods Explanation;Demonstration;Handout    Comprehension Returned demonstration;Verbalized understanding              PT Short Term Goals - 05/31/21 1858       PT SHORT TERM GOAL #1   Title = LTGs               PT Long Term Goals - 05/31/21 1859       PT LONG TERM GOAL #1   Title Patient will be independent with initial vestibular/balance HEP (All LTGs Due: 07/01/21)    Baseline no HEP Established    Time 4    Period Weeks    Status New    Target Date 07/01/21      PT LONG TERM GOAL #2   Title Patient will demonstrate (-) positional testing to indicate resolution of BPPV    Baseline L Horizontal Canal    Time 4    Period Weeks    Status New      PT LONG TERM GOAL #3   Title LTG to be set for FGA as applicable    Baseline TBA    Time 6    Period Weeks    Status New      PT LONG TERM GOAL #4   Title Patient will improve DPS >/= 60%    Baseline 52%    Time 4    Period Weeks    Status New                   Plan - 06/06/21 1013     Clinical Impression Statement Completed reassesment of BPPV, (-) positional testing today indicating resolution of L Horizontal Canal Canalithiasis. Completed rest of session with balance assesment, patient able to score 30/30 on FGA indicating low fallr isk. Mild dizziness with turns  and head movement with ambulation. Initiated HEP focused on VOR and habituation with patient tolerating well. Will continue to progress toward all LTGs.    Personal Factors and Comorbidities Comorbidity 3+    Comorbidities DVT, HLD, PE, Back Surgery, Neck Surgery,AAA    Examination-Activity Limitations Bed Mobility;Bend;Reach Overhead    Examination-Participation Restrictions Occupation    Stability/Clinical Decision Making Stable/Uncomplicated    Rehab Potential Good    PT Frequency 2x / week    PT Duration 4 weeks    PT Treatment/Interventions Canalith Repostioning;ADLs/Self Care Home Management;Cryotherapy;Iontophoresis 4mg /ml Dexamethasone;Moist Heat;Gait training;Stair training;Functional mobility training;Therapeutic activities;Balance training;Therapeutic exercise;Neuromuscular re-education;Patient/family education;Dry needling;Vestibular;Passive range of motion;Manual techniques;Joint Manipulations    PT Next Visit Plan Continue VOR + Balance Progression    Consulted and Agree with Plan of Care Patient             Patient will benefit from skilled therapeutic intervention in order to improve the following deficits and impairments:  Decreased balance, Decreased activity tolerance, Dizziness  Visit Diagnosis: Dizziness and giddiness  BPPV (benign paroxysmal positional vertigo),  left  Unsteadiness on feet     Problem List Patient Active Problem List   Diagnosis Date Noted   Acute neck pain 05/23/2017   Cervical radiculopathy 05/23/2017   S/P cervical spinal fusion 01/03/2017   Cervical disc disorder with radiculopathy 09/21/2016   SOB (shortness of breath) 08/24/2014   Dyslipidemia 08/24/2014   Pulmonary embolism (HCC)     Tempie Donning, PT, DPT 06/06/2021, 10:15 AM  [facility] [address] [place], [place], [address] Phone: [phone]   Fax:  [phone]  Name: Bentleigh Stankus MRN:  [id number] Date of Birth: 12-Apr-1956

## 2021-06-08 ENCOUNTER — Other Ambulatory Visit: Payer: Self-pay

## 2021-06-08 ENCOUNTER — Ambulatory Visit: Payer: Commercial Managed Care - PPO

## 2021-06-08 DIAGNOSIS — R42 Dizziness and giddiness: Secondary | ICD-10-CM | POA: Diagnosis not present

## 2021-06-08 DIAGNOSIS — R2681 Unsteadiness on feet: Secondary | ICD-10-CM

## 2021-06-08 NOTE — Therapy (Signed)
[facility] [address] [place], [place], [address] Phone: [phone]   Fax:  [phone]  Physical Therapy Treatment  Patient Details  Name: Kenneth Adams MRN: [id number] Date of Birth: 1956/03/13 Referring Provider (PT): Newman Pies, MD   Encounter Date: 06/08/2021   PT End of Session - 06/08/21 0931     Visit Number 3    Number of Visits 9    Date for PT Re-Evaluation 07/01/21    Authorization Type UHC    PT Start Time 0931    PT Stop Time 1011    PT Time Calculation (min) 40 min    Activity Tolerance Patient tolerated treatment well    Behavior During Therapy [facility] for tasks assessed/performed             Past Medical History:  Diagnosis Date   DVT (deep venous thrombosis) (HCC)    Hyperlipidemia    Pulmonary embolism (HCC) 2013   occurred after back surgyer    Past Surgical History:  Procedure Laterality Date   BACK SURGERY     NECK SURGERY  09/29/2016   NO PAST SURGERIES      There were no vitals filed for this visit.   Subjective Assessment - 06/08/21 0934     Subjective Patient reports that he felt a little off after the last session. No other new changes/complaints.    Pertinent History DVT, HLD, PE, Back Surgery, Neck Surgery, AAA    Limitations Walking;Standing    Patient Stated Goals Get back to flying status    Currently in Pain? No/denies              [facility] Adult PT Treatment/Exercise - 06/08/21 0001       Ambulation/Gait   Ambulation/Gait Yes    Ambulation/Gait Assistance 7: Independent    Assistive device None    Gait Pattern Within Functional Limits    Ambulation Surface Level;Indoor      High Level Balance   High Level Balance Activities Other (comment)    High Level Balance Comments Completed gait with eyes closed 4 x 30', then progressed to gait with eyes closed and horizontal/vertical head turns x 2 laps each. increased challenge with horizontal > vertical.       Therapeutic Activites    Therapeutic Activities Other Therapeutic Activities    Other Therapeutic Activities Completed horiz and vertical VOR x 1 with ambulation, 2 x 50' each. No significnat dizziness.      Neuro Re-ed    Neuro Re-ed Details  On inverted BOSU: completed horizontal/vertical head turns x 10 reps each direction. Then with EC 3 x 30 seconds. Increased postural sway with EC,CGA.             Vestibular Treatment/Exercise - 06/08/21 0001       Vestibular Treatment/Exercise   Vestibular Treatment Provided Gaze    Habituation Exercises Horizontal Roll    Gaze Exercises X1 Viewing Horizontal;X1 Viewing Vertical      Horizontal Roll   Number of Reps  5    Symptom Description  Completed x 5 reps to bilateral directions. Added to HEP      X1 Viewing Horizontal   Foot Position standing feet apart w/ patterned background; progressed to full tandem with patterned background    Reps 2    Comments x 60 secs. no dizziness but reports mild blurry vision      X1 Viewing Vertical   Foot Position standing feet apart w/ patterned background;  Reps 2    Comments x 60 secs. no dizziness but reports mild blurry vision. progressed to full tandem with patterned background             Rolling    With pillow under head, start on back. Roll slowly to right. Hold position until symptoms subside. Roll slowly onto left side. Hold position until symptoms subside. Repeat sequence 5 times per session. Do 2 sessions per day.  Copyright  VHI. All rights reserved.        PT Education - 06/08/21 1015     Education Details Decrease freq 1x/week    Person(s) Educated Patient    Methods Explanation    Comprehension Verbalized understanding              PT Short Term Goals - 05/31/21 1858       PT SHORT TERM GOAL #1   Title = LTGs               PT Long Term Goals - 05/31/21 1859       PT LONG TERM GOAL #1   Title Patient will be independent with initial  vestibular/balance HEP (All LTGs Due: 07/01/21)    Baseline no HEP Established    Time 4    Period Weeks    Status New    Target Date 07/01/21      PT LONG TERM GOAL #2   Title Patient will demonstrate (-) positional testing to indicate resolution of BPPV    Baseline L Horizontal Canal    Time 4    Period Weeks    Status New      PT LONG TERM GOAL #3   Title LTG to be set for FGA as applicable    Baseline TBA    Time 6    Period Weeks    Status New      PT LONG TERM GOAL #4   Title Patient will improve DPS >/= 60%    Baseline 52%    Time 4    Period Weeks    Status New                   Plan - 06/08/21 1016     Clinical Impression Statement Continued habituation and VOR progression to ambulation adn with patterned background with patient tolerating well. Overall no increase in dizziness with activities throughout session. continue to demo mild postural sway with EC with amulation and on complaint surface. Will continue to progresstoward goals.    Personal Factors and Comorbidities Comorbidity 3+    Comorbidities DVT, HLD, PE, Back Surgery, Neck Surgery,AAA    Examination-Activity Limitations Bed Mobility;Bend;Reach Overhead    Examination-Participation Restrictions Occupation    Stability/Clinical Decision Making Stable/Uncomplicated    Rehab Potential Good    PT Frequency 2x / week    PT Duration 4 weeks    PT Treatment/Interventions Canalith Repostioning;ADLs/Self Care Home Management;Cryotherapy;Iontophoresis 4mg /ml Dexamethasone;Moist Heat;Gait training;Stair training;Functional mobility training;Therapeutic activities;Balance training;Therapeutic exercise;Neuromuscular re-education;Patient/family education;Dry needling;Vestibular;Passive range of motion;Manual techniques;Joint Manipulations    PT Next Visit Plan Continue VOR + Balance Progression    Consulted and Agree with Plan of Care Patient             Patient will benefit from skilled therapeutic  intervention in order to improve the following deficits and impairments:  Decreased balance, Decreased activity tolerance, Dizziness  Visit Diagnosis: Dizziness and giddiness  Unsteadiness on feet     Problem List Patient Active Problem List  Diagnosis Date Noted   Acute neck pain 05/23/2017   Cervical radiculopathy 05/23/2017   S/P cervical spinal fusion 01/03/2017   Cervical disc disorder with radiculopathy 09/21/2016   SOB (shortness of breath) 08/24/2014   Dyslipidemia 08/24/2014   Pulmonary embolism (HCC)     Tempie Donning, PT, DPT 06/08/2021, 10:18 AM  [facility] [address] [place], [place], [address] Phone: [phone]   Fax:  [phone]  Name: Kenneth Adams MRN: [id number] Date of Birth: [birth year]

## 2021-06-08 NOTE — Patient Instructions (Signed)
Rolling    With pillow under head, start on back. Roll slowly to right. Hold position until symptoms subside. Roll slowly onto left side. Hold position until symptoms subside. Repeat sequence 5 times per session. Do 2 sessions per day.  Copyright  VHI. All rights reserved.

## 2021-06-13 ENCOUNTER — Ambulatory Visit: Payer: Commercial Managed Care - PPO

## 2021-06-13 ENCOUNTER — Other Ambulatory Visit: Payer: Self-pay

## 2021-06-13 DIAGNOSIS — R42 Dizziness and giddiness: Secondary | ICD-10-CM | POA: Diagnosis not present

## 2021-06-13 DIAGNOSIS — R2681 Unsteadiness on feet: Secondary | ICD-10-CM

## 2021-06-13 NOTE — Therapy (Signed)
[facility] [address] [place], [place], [address] Phone: [phone]   Fax:  [phone]  Physical Therapy Treatment  Patient Details  Name: Kenneth Adams MRN: [id number] Date of Birth: 1956/03/21 Referring Provider (PT): Newman Pies, MD   Encounter Date: 06/13/2021   PT End of Session - 06/13/21 1449     Visit Number 4    Number of Visits 9    Date for PT Re-Evaluation 07/01/21    Authorization Type UHC    PT Start Time 1447    PT Stop Time 1530    PT Time Calculation (min) 43 min    Activity Tolerance Patient tolerated treatment well    Behavior During Therapy [facility] for tasks assessed/performed             Past Medical History:  Diagnosis Date   DVT (deep venous thrombosis) (HCC)    Hyperlipidemia    Pulmonary embolism (HCC) 2013   occurred after back surgyer    Past Surgical History:  Procedure Laterality Date   BACK SURGERY     NECK SURGERY  09/29/2016   NO PAST SURGERIES      There were no vitals filed for this visit.   Subjective Assessment - 06/13/21 1449     Subjective Patient reports no new changes. Reports did not sleep well so feel a general fog today. Denies dizziness. Reports rolling went well.    Pertinent History DVT, HLD, PE, Back Surgery, Neck Surgery, AAA    Limitations Walking;Standing    Patient Stated Goals Get back to flying status    Currently in Pain? No/denies               Vestibular Treatment/Exercise - 06/13/21 0001       Vestibular Treatment/Exercise   Vestibular Treatment Provided Gaze    Gaze Exercises X2 Viewing Horizontal;X2 Viewing Vertical;X1 Viewing Horizontal;X1 Viewing Vertical      X1 Viewing Horizontal   Foot Position standing feet apart with stroop task on thick blue foam    Reps 1    Comments x 4 lines on Stroop Task.      X1 Viewing Vertical   Foot Position standing feet apart with stroop task on thick blue foam    Reps 1    Comments  x 4 lines on Stroop Task.      X2 Viewing Horizontal   Foot Position standing feet together; tandem stance    Reps 2     Comments x 30 seconds tandem; x 60 seconds feet together      X2 Viewing Vertical   Foot Position standing feet together; tandem stance    Reps 2    Comments x 30 seconds tandem; x 60 seconds feet together                Balance Exercises - 06/13/21 0001       Balance Exercises: Standing   Standing Eyes Opened Wide (BOA);Head turns;Foam/compliant surface;Limitations    Standing Eyes Opened Limitations standing on thick foam; horizontal/vertical head turns x 10 reps.    Standing Eyes Closed Narrow base of support (BOS);Wide (BOA);Head turns;Foam/compliant surface;3 reps;30 secs;Limitations    Standing Eyes Closed Limitations on thick blue foam, wide BOS with EC and horiz/vertical head turns x 10 reps each direction. then with narrow BOS EC 3 x 30 seconds    Sit to Stand Standard surface;Without upper extremity support;Foam/compliant surface;Limitations    Sit to Stand Limitations completed x 5 reps  with EO; x 10 reps with EC. supervision, mild sway with eyes closed.    Other Standing Exercises on blue mat on upward incline: compelted marching without UE support and EO x 30 seconds, then progressed to addition of horizontal/vertical head turns to marching x 10 reps each.t hen with head static completed marching with EC x 30 seconds. Staggered stance on incline with EC 2 x 30 seconds, then EO head turns x 10 reps each.                PT Education - 06/13/21 1651     Education Details VOR Update    Person(s) Educated Patient    Methods Explanation;Handout;Demonstration    Comprehension Returned demonstration;Verbalized understanding              PT Short Term Goals - 05/31/21 1858       PT SHORT TERM GOAL #1   Title = LTGs               PT Long Term Goals - 05/31/21 1859       PT LONG TERM GOAL #1   Title Patient will be independent  with initial vestibular/balance HEP (All LTGs Due: 07/01/21)    Baseline no HEP Established    Time 4    Period Weeks    Status New    Target Date 07/01/21      PT LONG TERM GOAL #2   Title Patient will demonstrate (-) positional testing to indicate resolution of BPPV    Baseline L Horizontal Canal    Time 4    Period Weeks    Status New      PT LONG TERM GOAL #3   Title LTG to be set for FGA as applicable    Baseline TBA    Time 6    Period Weeks    Status New      PT LONG TERM GOAL #4   Title Patient will improve DPS >/= 60%    Baseline 52%    Time 4    Period Weeks    Status New                   Plan - 06/13/21 1652     Clinical Impression Statement Continued VOR progress and continued high level balance on complaint surfaces. No reports of dizziness throughout session. Updated VOR x 2 to HEP. Mild imbalance with tandem stance and on thick foam Will continue to progress toward all LTGs.    Personal Factors and Comorbidities Comorbidity 3+    Comorbidities DVT, HLD, PE, Back Surgery, Neck Surgery,AAA    Examination-Activity Limitations Bed Mobility;Bend;Reach Overhead    Examination-Participation Restrictions Occupation    Stability/Clinical Decision Making Stable/Uncomplicated    Rehab Potential Good    PT Frequency 2x / week    PT Duration 4 weeks    PT Treatment/Interventions Canalith Repostioning;ADLs/Self Care Home Management;Cryotherapy;Iontophoresis 4mg /ml Dexamethasone;Moist Heat;Gait training;Stair training;Functional mobility training;Therapeutic activities;Balance training;Therapeutic exercise;Neuromuscular re-education;Patient/family education;Dry needling;Vestibular;Passive range of motion;Manual techniques;Joint Manipulations    PT Next Visit Plan Continue VOR + Balance Progression    Consulted and Agree with Plan of Care Patient             Patient will benefit from skilled therapeutic intervention in order to improve the following deficits  and impairments:  Decreased balance, Decreased activity tolerance, Dizziness  Visit Diagnosis: Dizziness and giddiness  Unsteadiness on feet     Problem List Patient Active Problem List  Diagnosis Date Noted   Acute neck pain 05/23/2017   Cervical radiculopathy 05/23/2017   S/P cervical spinal fusion 01/03/2017   Cervical disc disorder with radiculopathy 09/21/2016   SOB (shortness of breath) 08/24/2014   Dyslipidemia 08/24/2014   Pulmonary embolism (HCC)     Tempie Donning, PT, DPT 06/13/2021, 4:54 PM  [facility] [address] [place], [place], [address] Phone: [phone]   Fax:  [phone]  Name: Anterrio Mccleery MRN: [id number] Date of Birth: 1955/10/29

## 2021-06-13 NOTE — Patient Instructions (Signed)
Gaze Stabilization: Standing Feet Together    Feet together, keeping eyes on target, move target opposite of head, tilt head down 15-30 and move head side to side for 60 seconds. Repeat while moving head up and down for 60 seconds. Do 2-3 sessions per day.  Copyright  VHI. All rights reserved.

## 2021-06-20 ENCOUNTER — Ambulatory Visit: Payer: Commercial Managed Care - PPO

## 2021-06-20 ENCOUNTER — Other Ambulatory Visit: Payer: Self-pay

## 2021-06-20 DIAGNOSIS — R42 Dizziness and giddiness: Secondary | ICD-10-CM

## 2021-06-20 DIAGNOSIS — R2681 Unsteadiness on feet: Secondary | ICD-10-CM

## 2021-06-20 NOTE — Patient Instructions (Signed)
Gaze Stabilization: Marching in Place (Complaint Surface)      Marching in place on complaint (pillow/foam) surface, keep eyes on target on wall 3-4 feet away, tilt head down 15-30 and move head side to side for 60 seconds. Repeat while moving head up and down for 60 seconds. Do 2-3 sessions per day. Repeat using target on pattern background.  Copyright  VHI. All rights reserved.

## 2021-06-20 NOTE — Therapy (Signed)
[facility] [address] [place], [place], [address] Phone: [phone]   Fax:  [phone]  Physical Therapy Treatment  Patient Details  Name: Kenneth Adams MRN: [id number] Date of Birth: Jun 11, 1956 Referring Provider (PT): Newman Pies, MD   Encounter Date: 06/20/2021   PT End of Session - 06/20/21 1448     Visit Number 5    Number of Visits 9    Date for PT Re-Evaluation 07/01/21    Authorization Type UHC    PT Start Time 1447    PT Stop Time 1529    PT Time Calculation (min) 42 min    Activity Tolerance Patient tolerated treatment well    Behavior During Therapy [facility] for tasks assessed/performed             Past Medical History:  Diagnosis Date   DVT (deep venous thrombosis) (HCC)    Hyperlipidemia    Pulmonary embolism (HCC) 2013   occurred after back surgyer    Past Surgical History:  Procedure Laterality Date   BACK SURGERY     NECK SURGERY  09/29/2016   NO PAST SURGERIES      There were no vitals filed for this visit.   Subjective Assessment - 06/20/21 1449     Subjective Patient reports no new changes/complaints. Denies any dizziness, did have some nausea.    Pertinent History DVT, HLD, PE, Back Surgery, Neck Surgery, AAA    Limitations Walking;Standing    Patient Stated Goals Get back to flying status    Currently in Pain? No/denies                [facility] Adult PT Treatment/Exercise - 06/20/21 0001       Transfers   Transfers Sit to Stand;Stand to Sit    Comments completed sit <> stands with LLE positioned posterior and head down x 5 reps to offset patient R weight shift preference      Ambulation/Gait   Ambulation/Gait Yes    Ambulation/Gait Assistance 7: Independent    Ambulation Distance (Feet) --   with activities   Assistive device None    Gait Pattern Within Functional Limits    Ambulation Surface Level;Indoor      Therapeutic Activites    Therapeutic Activities  Other Therapeutic Activities    Other Therapeutic Activities with eyes focused on target completed bouncing on trampoline x 30 seconds, then progressed to R/L and Up/Down head turns x 10 reps each. No Dizziness. Added in cognitive task card (numbers) with patient having to add with each head turns to further challenge dual task, completed x 4 lines             Vestibular Treatment/Exercise - 06/20/21 0001       Vestibular Treatment/Exercise   Vestibular Treatment Provided Gaze    Gaze Exercises X1 Viewing Horizontal;X1 Viewing Vertical      X1 Viewing Horizontal   Foot Position standing on foam marching; tandem stance firm surface(2 reps)    Reps 3    Comments x 60 seconds.      X1 Viewing Vertical   Foot Position standing on foam marching; tandem stance firm surface(2 reps)    Reps 3    Comments x 60 seconds; no dizziness reported                Balance Exercises - 06/20/21 0001       Balance Exercises: Standing   Tandem Stance Eyes closed;Foam/compliant surface;Limitations  Tandem Stance Time on thick blue foam: completed full tandem with eyes closed 3 x 20-30 seconds alternating foot position. Then moved to 3/4th tandem stance with eyes closed and horizontal/vertical head turns x 10 reps each and alternating foot position. Increased challenge iwth LLE posterior.    SLS Eyes open;Limitations    SLS Limitations standing SLS completed trunk rotation with tossing ball to rebounder x 10 reps on RLE, intermittent touch down and CGA from PT. then complted x 10 reps on LLE. increased challenge with SLS On LLE > RLE.                PT Education - 06/20/21 1620     Education Details VOR update    Person(s) Educated Patient    Methods Explanation;Demonstration;Handout    Comprehension Verbalized understanding;Returned demonstration              PT Short Term Goals - 05/31/21 1858       PT SHORT TERM GOAL #1   Title = LTGs               PT Long  Term Goals - 05/31/21 1859       PT LONG TERM GOAL #1   Title Patient will be independent with initial vestibular/balance HEP (All LTGs Due: 07/01/21)    Baseline no HEP Established    Time 4    Period Weeks    Status New    Target Date 07/01/21      PT LONG TERM GOAL #2   Title Patient will demonstrate (-) positional testing to indicate resolution of BPPV    Baseline L Horizontal Canal    Time 4    Period Weeks    Status New      PT LONG TERM GOAL #3   Title LTG to be set for FGA as applicable    Baseline TBA    Time 6    Period Weeks    Status New      PT LONG TERM GOAL #4   Title Patient will improve DPS >/= 60%    Baseline 52%    Time 4    Period Weeks    Status New                   Plan - 06/20/21 1621     Clinical Impression Statement Progressed VOR to mirror background and more dynamic tasks including marching, patient tolerating very well. Continued SLS activities and tandem on complaint surface. more challenge note with SLS on LLE. will continue to progress towrd LTGs.    Personal Factors and Comorbidities Comorbidity 3+    Comorbidities DVT, HLD, PE, Back Surgery, Neck Surgery,AAA    Examination-Activity Limitations Bed Mobility;Bend;Reach Overhead    Examination-Participation Restrictions Occupation    Stability/Clinical Decision Making Stable/Uncomplicated    Rehab Potential Good    PT Frequency 2x / week    PT Duration 4 weeks    PT Treatment/Interventions Canalith Repostioning;ADLs/Self Care Home Management;Cryotherapy;Iontophoresis 4mg /ml Dexamethasone;Moist Heat;Gait training;Stair training;Functional mobility training;Therapeutic activities;Balance training;Therapeutic exercise;Neuromuscular re-education;Patient/family education;Dry needling;Vestibular;Passive range of motion;Manual techniques;Joint Manipulations    PT Next Visit Plan Check LTG + D/C?    Consulted and Agree with Plan of Care Patient             Patient will benefit from  skilled therapeutic intervention in order to improve the following deficits and impairments:  Decreased balance, Decreased activity tolerance, Dizziness  Visit Diagnosis: Dizziness and giddiness  Unsteadiness on  feet     Problem List Patient Active Problem List   Diagnosis Date Noted   Acute neck pain 05/23/2017   Cervical radiculopathy 05/23/2017   S/P cervical spinal fusion 01/03/2017   Cervical disc disorder with radiculopathy 09/21/2016   SOB (shortness of breath) 08/24/2014   Dyslipidemia 08/24/2014   Pulmonary embolism (HCC)     Tempie Donning, PT, DPT 06/20/2021, 4:23 PM  [place] [facility] [address] [place], [place], [address] Phone: [phone]   Fax:  [phone]  Name: Kenneth Adams MRN: [id number] Date of Birth: Aug 17, 1956

## 2021-06-28 ENCOUNTER — Ambulatory Visit: Payer: Commercial Managed Care - PPO

## 2021-07-06 ENCOUNTER — Other Ambulatory Visit: Payer: Self-pay

## 2021-07-06 ENCOUNTER — Ambulatory Visit: Payer: Commercial Managed Care - PPO | Attending: Otolaryngology

## 2021-07-06 DIAGNOSIS — R42 Dizziness and giddiness: Secondary | ICD-10-CM | POA: Diagnosis not present

## 2021-07-06 DIAGNOSIS — H8112 Benign paroxysmal vertigo, left ear: Secondary | ICD-10-CM | POA: Insufficient documentation

## 2021-07-06 DIAGNOSIS — R2681 Unsteadiness on feet: Secondary | ICD-10-CM | POA: Diagnosis present

## 2021-07-06 NOTE — Patient Instructions (Addendum)
Gaze Stabilization: Marching in Place (Complaint Surface)      Marching in place on complaint (pillow/foam) surface, keep eyes on target on wall 3-4 feet away, tilt head down 15-30 and move head side to side for 60 seconds. Repeat while moving head up and down for 60 seconds. Do 2-3 sessions per day. Repeat using target on pattern background.    Gaze Stabilization: Standing Feet Together on Complaint Surface (VOR X 2 - Move Head/Arm Opposite)     Feet together, keeping eyes on target, move target opposite of head, tilt head down 15-30 and move head side to side for 60 seconds. Repeat while moving head up and down for 60 seconds. Do 2-3 sessions per day.   Walking Head Turn    Standing close to a wall, walk 50 feet while turning head side to side to target. Repeat 3 times. Do 1 sessions per day.

## 2021-07-06 NOTE — Therapy (Signed)
[facility] [address] [place], [place], [address] Phone: [phone]   Fax:  [phone]  Physical Therapy Treatment/Discharge Summary/Re-cert for D/C Visit  Patient Details  Name: Kenneth Adams MRN: [id number] Date of Birth: 10/24/1955 Referring Provider (PT): Leta Baptist, MD  PHYSICAL THERAPY DISCHARGE SUMMARY  Visits from Start of Care: 6  Current functional level related to goals / functional outcomes: See Clinical Impression Statement   Remaining deficits: None   Education / Equipment: HEP Provided   Patient agrees to discharge. Patient goals were met. Patient is being discharged due to meeting the stated rehab goals.   Encounter Date: 07/06/2021   PT End of Session - 07/06/21 0936     Visit Number 6    Number of Visits 9    Date for PT Re-Evaluation 07/07/21    Authorization Type UHC    PT Start Time 0931    PT Stop Time 1009    PT Time Calculation (min) 38 min    Activity Tolerance Patient tolerated treatment well    Behavior During Therapy WFL for tasks assessed/performed             Past Medical History:  Diagnosis Date   DVT (deep venous thrombosis) ([place])    Hyperlipidemia    Pulmonary embolism ([place]) 2013   occurred after back surgyer    Past Surgical History:  Procedure Laterality Date   BACK SURGERY     NECK SURGERY  09/29/2016   NO PAST SURGERIES      There were no vitals filed for this visit.   Subjective Assessment - 07/06/21 0935     Subjective Reports dizziness has been good. Feels like he does have a brief sensation with quick head turns.    Pertinent History DVT, HLD, PE, Back Surgery, Neck Surgery, AAA    Limitations Walking;Standing    Patient Stated Goals Get back to flying status    Currently in Pain? No/denies                [facility] PT Assessment - 07/06/21 0001       Assessment   Medical Diagnosis Dizziness    Referring Provider (PT) Leta Baptist, MD       Observation/Other Assessments   Focus on Therapeutic Outcomes (FOTO)  DFS: 61%, DPS: 60%                 Vestibular Assessment - 07/06/21 0001       Positional Testing   Dix-Hallpike Dix-Hallpike Right;Dix-Hallpike Left    Horizontal Canal Testing Horizontal Canal Right;Horizontal Canal Left      Dix-Hallpike Right   Dix-Hallpike Right Duration 0    Dix-Hallpike Right Symptoms No nystagmus      Dix-Hallpike Left   Dix-Hallpike Left Duration 0    Dix-Hallpike Left Symptoms No nystagmus      Horizontal Canal Right   Horizontal Canal Right Duration 0    Horizontal Canal Right Symptoms Normal      Horizontal Canal Left   Horizontal Canal Left Duration 0    Horizontal Canal Left Symptoms Normal             Reviewed entire HEP and progressed to patient's tolerance:   Gaze Stabilization: Marching in Place (Complaint Surface)      Marching in place on complaint (pillow/foam) surface, keep eyes on target on wall 3-4 feet away, tilt head down 15-30 and move head side to side for 60 seconds. Repeat while moving  head up and down for 60 seconds. Do 2-3 sessions per day. Repeat using target on pattern background.    Gaze Stabilization: Standing Feet Together on Complaint Surface (VOR X 2 - Move Head/Arm Opposite)     Feet together, keeping eyes on target, move target opposite of head, tilt head down 15-30 and move head side to side for 60 seconds. Repeat while moving head up and down for 60 seconds. Do 2-3 sessions per day.   Walking Head Turn    Standing close to a wall, walk 50 feet while turning head side to side to target. Repeat 3 times. Do 1 sessions per day.       PT Education - 07/06/21 1008     Education Details progress toward LTGs; HEP Update    Person(s) Educated Patient    Methods Explanation;Handout;Demonstration    Comprehension Verbalized understanding;Returned demonstration              PT Short Term Goals - 05/31/21 1858        PT SHORT TERM GOAL #1   Title = LTGs               PT Long Term Goals - 07/06/21 1018       PT LONG TERM GOAL #1   Title Patient will be independent with initial vestibular/balance HEP (All LTGs Due: 07/01/21)    Baseline no HEP Established; reports independence with HEP    Time 4    Period Weeks    Status Achieved      PT LONG TERM GOAL #2   Title Patient will demonstrate (-) positional testing to indicate resolution of BPPV    Baseline L Horizontal Canal; Negative Positional Testing    Time 4    Period Weeks    Status Achieved      PT LONG TERM GOAL #3   Title LTG to be set for FGA as applicable    Baseline TBA; patient scored 30/30 on assesment    Time 6    Period Weeks    Status Deferred      PT LONG TERM GOAL #4   Title Patient will improve DPS >/= 60%    Baseline 52%; 60%    Time 4    Period Weeks    Status Achieved                   Plan - 07/06/21 1018     Clinical Impression Statement Completed assesment of patient's progress toward all LTG. Patient able to meet all LTG demonstrating improved activity tolerance, negative positional testing indicating resolution of BPPV, and low fall risk. Patient demo readiness to d/c with updated HEP at this time. With patient agreeable to d/c. Patient has made significant progress with PT services.    Personal Factors and Comorbidities Comorbidity 3+    Comorbidities DVT, HLD, PE, Back Surgery, Neck Surgery,AAA    Examination-Activity Limitations Bed Mobility;Bend;Reach Overhead    Examination-Participation Restrictions Occupation    Stability/Clinical Decision Making Stable/Uncomplicated    Rehab Potential Good    PT Frequency 2x / week    PT Duration 4 weeks    PT Treatment/Interventions Canalith Repostioning;ADLs/Self Care Home Management;Cryotherapy;Iontophoresis 83m/ml Dexamethasone;Moist Heat;Gait training;Stair training;Functional mobility training;Therapeutic activities;Balance  training;Therapeutic exercise;Neuromuscular re-education;Patient/family education;Dry needling;Vestibular;Passive range of motion;Manual techniques;Joint Manipulations    Consulted and Agree with Plan of Care Patient             Patient will benefit from skilled therapeutic intervention in  order to improve the following deficits and impairments:  Decreased balance, Decreased activity tolerance, Dizziness  Visit Diagnosis: Dizziness and giddiness  Unsteadiness on feet  BPPV (benign paroxysmal positional vertigo), left     Problem List Patient Active Problem List   Diagnosis Date Noted   Acute neck pain 05/23/2017   Cervical radiculopathy 05/23/2017   S/P cervical spinal fusion 01/03/2017   Cervical disc disorder with radiculopathy 09/21/2016   SOB (shortness of breath) 08/24/2014   Dyslipidemia 08/24/2014   Pulmonary embolism ([place])     Jones Bales, PT, DPT 07/06/2021, 10:21 AM  [facility] [address] [place], [place], [address] Phone: [phone]   Fax:  [phone]  Name: Kenneth Adams MRN: [id number] Date of Birth: 1956/07/12

## 2022-05-11 ENCOUNTER — Other Ambulatory Visit (HOSPITAL_COMMUNITY): Payer: Self-pay | Admitting: Physician Assistant

## 2022-05-11 DIAGNOSIS — Z0000 Encounter for general adult medical examination without abnormal findings: Secondary | ICD-10-CM

## 2022-05-19 ENCOUNTER — Ambulatory Visit (HOSPITAL_COMMUNITY)
Admission: RE | Admit: 2022-05-19 | Discharge: 2022-05-19 | Disposition: A | Discharge status: Home / Self Care | Payer: Self-pay | Source: Ambulatory Visit | Attending: Physician Assistant | Admitting: Physician Assistant

## 2022-05-19 DIAGNOSIS — Z0000 Encounter for general adult medical examination without abnormal findings: Secondary | ICD-10-CM | POA: Insufficient documentation

## 2023-05-23 ENCOUNTER — Other Ambulatory Visit: Payer: Self-pay

## 2023-05-23 DIAGNOSIS — I7143 Infrarenal abdominal aortic aneurysm, without rupture: Secondary | ICD-10-CM

## 2023-06-08 ENCOUNTER — Ambulatory Visit (INDEPENDENT_AMBULATORY_CARE_PROVIDER_SITE_OTHER): Payer: Commercial Managed Care - PPO | Admitting: Physician Assistant

## 2023-06-08 ENCOUNTER — Ambulatory Visit (HOSPITAL_COMMUNITY)
Admission: RE | Admit: 2023-06-08 | Discharge: 2023-06-08 | Disposition: A | Discharge status: Home / Self Care | Payer: Commercial Managed Care - PPO | Source: Ambulatory Visit | Attending: Vascular Surgery | Admitting: Vascular Surgery

## 2023-06-08 VITALS — BP 162/82 | HR 66 | Temp 97.2°F | Resp 18 | Ht 72.0 in | Wt 203.1 lb

## 2023-06-08 DIAGNOSIS — I7143 Infrarenal abdominal aortic aneurysm, without rupture: Secondary | ICD-10-CM

## 2023-06-08 NOTE — Progress Notes (Signed)
Kenneth Adams HISTORY AND PHYSICAL   History of Present Illness:  Patient is a 67 y.o. year old male who was originally seen by Dr. Edilia Bo for AAA with a family history of AAA.   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   This has been stable without symptoms of abdominal or lumbar pain.    He did have a right lower extremity DVT 9 years ago with bilateral PEs.  He was on Coumadin for a while.  He underwent a hypercoagulable work-up which was unremarkable.  He has had no problems since then.  This DVT in the PEs occurred after back surgery.  This was in [place].   He denies new abdominal or lumbar pain.  He does have left anterior radicular thigh pain due to spinal issue and he is followed by a spinal MD.  He tires to walk for exercise and is limited with back pain issues.  He denies rest pain, claudication or non healing wounds.  He has had no recurrent DVT/PE episodes.     He is here today for AAA surveillance with duplex.  On his last visit 2 years ago the largest AAA diameter was 2.22 cm distal aorta.   Past Medical History:  Diagnosis Date   DVT (deep venous thrombosis) (HCC)    Hyperlipidemia    Pulmonary embolism (HCC) 2013   occurred after back surgyer    Past Surgical History:  Procedure Laterality Date   BACK SURGERY     NECK SURGERY  09/29/2016   NO PAST SURGERIES      ROS:   General:  No weight loss, Fever, chills  HEENT: No recent headaches, no nasal bleeding, no visual changes, no sore throat  Neurologic: No dizziness, blackouts, seizures. No recent symptoms of stroke or mini- stroke. No recent episodes of slurred speech, or temporary blindness.  Cardiac: No recent episodes of chest pain/pressure, no shortness of breath at rest.  No shortness of breath with exertion.  Denies history of atrial fibrillation or irregular heartbeat  Kenneth: No  history of rest pain in feet.  No history of claudication.  No history of non-healing ulcer, + history of DVT   Pulmonary: No home oxygen, no productive cough, no hemoptysis,  No asthma or wheezing  Musculoskeletal:  [ ]  Arthritis, [x ] Low back pain,  [ ]  Joint pain  Hematologic:No history of hypercoagulable state.  No history of easy bleeding.  No history of anemia  Gastrointestinal: No hematochezia or melena,  No gastroesophageal reflux, no trouble swallowing  Urinary: [ ]  chronic Kidney disease, [ ]  on HD - [ ]  MWF or [ ]  TTHS, [ ]  Burning with urination, [ ]  Frequent urination, [ ]  Difficulty urinating;   Skin: No rashes  Psychological: No history of anxiety,  No history of depression  Social History Social History   Tobacco Use   Smoking status: Never   Smokeless tobacco: Never  Vaping Use   Vaping status: Never Used  Substance Use Topics   Alcohol use: Yes    Alcohol/week: 5.0 standard drinks of alcohol    Types: 3 Glasses of wine, 2 Shots of liquor per week    Comment: 2-3x weekly   Drug use: No    Family History Family History  Problem Relation Age of Onset   Cancer - Lung Mother    Cancer Mother  Hyperlipidemia Mother    Hyperlipidemia Father     Allergies  Allergies  Allergen Reactions   Rivaroxaban Nausea Only    Other reaction(s): Other Extreme tiredness     Current Outpatient Medications  Medication Sig Dispense Refill   clobetasol (TEMOVATE) 0.05 % external solution APPLY TO SCALP EVERY DAY     dutasteride (AVODART) 0.5 MG capsule Take 0.5 mg by mouth daily.     ketoconazole (NIZORAL) 2 % shampoo SHAMPOO ONCE DAILY     montelukast (SINGULAIR) 10 MG tablet Take 10 mg by mouth daily.     rosuvastatin (CRESTOR) 20 MG tablet Take 20 mg by mouth daily.     tamsulosin (FLOMAX) 0.4 MG CAPS capsule Take 0.4 mg by mouth daily.     No current facility-administered medications for this visit.    Physical Examination  Vitals:   06/08/23 1032   BP: (!) 162/82  Pulse: 66  Resp: 18  Temp: (!) 97.2 F (36.2 C)  TempSrc: Temporal  SpO2: 95%  Weight: 203 lb 1.6 oz (92.1 kg)  Height: 6' (1.829 m)    Body mass index is 27.55 kg/m.  General:  Alert and oriented, no acute distress HEENT: Normal Neck: No bruit or JVD Pulmonary: Clear to auscultation bilaterally Cardiac: Regular Rate and Rhythm without murmur Abdomen: Soft, non-tender, non-distended, no mass, no scars Skin: No rash Extremity Pulses:   radial,  femoral,  posterior tibial pulses bilaterally Musculoskeletal: No deformity or edema  Neurologic: Upper and lower extremity motor 5/5 and symmetric  DATA:   Abdominal Aorta Findings:  +-----------+-------+----------+----------+--------+--------+--------+  Location  AP (cm)Trans (cm)PSV (cm/s)WaveformThrombusComments  +-----------+-------+----------+----------+--------+--------+--------+  Proximal  2.33   2.53                                          +-----------+-------+----------+----------+--------+--------+--------+  Mid       1.92   2.22      68                                  +-----------+-------+----------+----------+--------+--------+--------+  Distal    2.16   2.21      65                                  +-----------+-------+----------+----------+--------+--------+--------+  RT CIA Prox1.2    1.2       72                                  +-----------+-------+----------+----------+--------+--------+--------+  LT CIA Prox1.2    1.4       90                                  +-----------+-------+----------+----------+--------+--------+--------+      Summary:  Abdominal Aorta: Mild dilatation of the aorta.  See above for comparison.   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 PT pulses are palpable B LE  AAA largest 2.53/2.33 proximal  aorta.  The iliacs right 1.2 and left 1.4 on duplex today.  These measurements are basically unchanged.  This measurement was done 2 years apart and he remains asymptomatic.  He will continue to walk as tolerates and take daily Statin for medical management.    He will f/u in 2 years for repeat surveillance.  If he develops sever abdominal pain or lumbar pain out of the ordinary he will call 911.      Mosetta Pigeon PA-C [facility] Office: [phone]  MD in clinic [place]

## 2023-09-07 ENCOUNTER — Telehealth (INDEPENDENT_AMBULATORY_CARE_PROVIDER_SITE_OTHER): Payer: Self-pay | Admitting: Otolaryngology

## 2023-09-07 NOTE — Telephone Encounter (Signed)
Called patient and left vmail msg to call our office. Reason for call:  To notify patient there is a $25 fee that needs to be paid prior to [place] faxing or releasing disability paperwork.

## 2024-08-26 ENCOUNTER — Encounter (INDEPENDENT_AMBULATORY_CARE_PROVIDER_SITE_OTHER): Payer: Self-pay | Admitting: Otolaryngology

## 2024-08-26 ENCOUNTER — Ambulatory Visit (INDEPENDENT_AMBULATORY_CARE_PROVIDER_SITE_OTHER): Admitting: Otolaryngology

## 2024-08-26 VITALS — BP 129/74 | HR 70 | Temp 97.5°F | Ht 72.0 in | Wt 210.0 lb

## 2024-08-26 DIAGNOSIS — R42 Dizziness and giddiness: Secondary | ICD-10-CM | POA: Diagnosis not present

## 2024-08-26 DIAGNOSIS — T402X5A Adverse effect of other opioids, initial encounter: Secondary | ICD-10-CM

## 2024-08-26 NOTE — Progress Notes (Signed)
 Patient ID: Kenneth Adams, male   DOB: 1955-10-23, 68 y.o.   MRN: [id number]  New complaint: Dizziness  Discussed the use of AI scribe software for clinical note transcription with the patient, who gave verbal consent to proceed.  History of Present Illness Kenneth Adams is a 68 year old male who presents with dizziness following the use of a pain patch.  He experienced severe dizziness after applying a low-dose buprenorphine patch for chronic back pain. Approximately ten hours post-application, he had a complete loss of balance and severe vertigo, describing it as 'spinning like crazy'.  Within thirty minutes of cutting the patch in half, his symptoms worsened, leading him to remove the patch entirely. The following morning, he was unable to walk or close his eyes without experiencing intense spinning sensations. These symptoms persisted for over two weeks before gradually resolving.  He has a history of using opiates without similar adverse effects and has never experienced such a reaction before. He describes the dizziness as more debilitating than his back pain, stating, 'that's almost worse than pain'.  No current hearing loss, ear pain, or drainage. He also has neuropathy in his feet for which he takes medication without issue.  Exam: General: Communicates without difficulty, well nourished, no acute distress. Head: Normocephalic, no evidence injury, no tenderness, facial buttresses intact without stepoff. Face/sinus: No tenderness to palpation and percussion. Facial movement is normal and symmetric. Eyes: PERRL, EOMI. No scleral icterus, conjunctivae clear. Neuro: CN II exam reveals vision grossly intact.  No nystagmus at any point of gaze. Ears: Auricles well formed without lesions.  Ear canals are intact without mass or lesion.  No erythema or edema is appreciated.  The TMs are intact without fluid. Nose: External evaluation reveals normal support and skin without lesions.   Dorsum is intact.  Anterior rhinoscopy reveals congested mucosa over anterior aspect of inferior turbinates and intact septum.  No purulence noted. Oral:  Oral cavity and oropharynx are intact, symmetric, without erythema or edema.  Mucosa is moist without lesions. Neck: Full range of motion without pain.  There is no significant lymphadenopathy.  No masses palpable.  Thyroid bed within normal limits to palpation.  Parotid glands and submandibular glands equal bilaterally without mass.  Trachea is midline. Neuro:  CN 2-12 grossly intact.  Vestibular: No nystagmus at any point of gaze. Dix Hallpike negative. Vestibular: There is no nystagmus with pneumatic pressure on either tympanic membrane or Valsalva. The cerebellar examination is unremarkable.    Assessment and Plan Assessment & Plan Adverse reaction to buprenorphine Experienced severe dizziness and imbalance after applying a low-dose buprenorphine patch (15 micrograms per hour) for back pain. Symptoms included inability to close eyes, spinning sensation, and difficulty walking, lasting over two weeks.  No prior similar reactions to opiates. Symptoms resolved after discontinuing the patch. - Avoid buprenorphine patches in the future due to hypersensitivity reaction. - Other possible differential diagnosis of dizziness are also discussed.  Questions are invited and answered.  History of benign paroxysmal positional vertigo No current symptoms of BPPV. Previous episodes resolved. No dizziness or imbalance with head movements. Dix-Hallpike maneuver performed to assess vestibular function, and it is negative.

## 2024-10-10 NOTE — ED Provider Notes (Signed)
 " [facility]  ED Provider Note  Kenneth Adams 69 y.o. male DOB: [birth year] MRN: [id number] History   Chief Complaint  Patient presents with   Abnormal Labs    He has a RUL and RLL PE. Sent by Pulmonology.   The patient was referred to [facility] ED for abnormal outpatient CT scan indicating PE.  The patient had been referred to [facility] ED for definitive treatment.  He had outpatient negative Doppler ultrasounds of the legs.  The patient denies any underlying chronic hereditary clotting disorders.  He does report prior PE in the distant past postoperatively when he was laid up from surgery.  He denies any leg pain or swelling.  He denies any hemoptysis he denies any fevers nausea or vomiting.   History provided by:  Patient      Past Medical History:  Diagnosis Date   AAA (abdominal aortic aneurysm)    Allergy    seasonal   Back pain    CAD (coronary artery disease)    DVT (deep venous thrombosis) (*)    right leg   Hyperlipidemia    Kidney stone    Pulmonary embolism (*)    x 2 post back surgery    Past Surgical History:  Procedure Laterality Date   Colonoscopy  2018   Lumbar fusion     Neck surgery     fusion   Neck surgery  2019   Spine surgery  06/14/2012    Social History   Substance and Sexual Activity  Alcohol Use Yes   Alcohol/week: 3.0 standard drinks of alcohol   Types: 3 Glasses of wine per week   Comment: OCC   Tobacco Use History[1] E-Cigarettes   Vaping Use Never User    Start Date     Cartridges/Day     Quit Date     Social History   Substance and Sexual Activity  Drug Use No         Allergies[2]  Discharge Medication List as of 10/10/2024 12:19 PM     CONTINUE these medications which have NOT CHANGED   Details  albuterol sulfate HFA (PROVENTIL,VENTOLIN,PROAIR) 108 (90 Base) MCG/ACT inhaler INHALE TWO PUFFS INTO THE LUNGS EVERY 6 HOURS AS NEEDED., Starting Thu 11/15/2023, Normal     clobetasol (TEMOVATE) 0.05% external solution APPLY TO SCALP DAILY AS NEEDED, Historical Med    cyclobenzaprine (FLEXERIL) 10 mg tablet Take one tablet (10 mg dose) by mouth 3 (three) times a day as needed., Starting Mon 01/01/2023, Historical Med    doxycycline hyclate (VIBRA-TABS) 100 mg tablet Take one tablet (100 mg dose) by mouth 2 (two) times daily for 7 days., Starting Fri 10/10/2024, Until Fri 10/17/2024, Normal    dutasteride (AVODART) 0.5 MG capsule Take one capsule (0.5 mg dose) by mouth daily., Starting Fri 07/18/2024, Normal    fluticasone-salmeterol (ADVAIR DISKUS/WIXELA) 100-50 mcg/dose AEPB inhalation powder Inhale one puff into the lungs 2 (two) times daily., Starting Thu 10/09/2024, Normal    gabapentin (NEURONTIN) 300 mg capsule Take one capsule (300 mg dose) by mouth 4 (four) times daily., Starting Wed 07/05/2022, Historical Med    hydrocortisone 2.5 % cream Apply topically daily as needed (on face)., Starting Tue 10/17/2022, Historical Med    ketoconazole (NIZORAL) 2% shampoo APPLY EVERY DAY AS NEEDED, Historical Med    meloxicam (MOBIC) 7.5 mg tablet Take one tablet (7.5 mg dose) by mouth daily., Starting Fri 07/18/2024, Normal    Misc. Devices MISC Start AutoCPAP at  5-15 cm. water pressure.  Prefer Resmed S11 AutoCPAP machine with a mask, supplies and heated tubing for moderate OSA with AHI 20.  Please use a mask of patient preference.  Send to a local DME., Print    montelukast (SINGULAIR) 10 MG tablet Take one tablet (10 mg dose) by mouth at bedtime., Starting Thu 07/17/2019, Historical Med    Multiple Vitamins-Minerals (MULTIVITAMIN GUMMIES ADULT PO) Take by mouth every evening., Historical Med    oxybutynin (DITROPAN) 5 MG tablet Take one tablet (5 mg dose) by mouth as needed., Starting Fri 07/18/2024, Normal    Peppermint Oil (IBGARD) 90 MG CPCR as directed Orally, Historical Med    Probiotic Product (PROBIOTIC DAILY PO) Take by mouth every evening., Historical  Med    rosuvastatin calcium (CRESTOR) 20 mg tablet Take one tablet (20 mg dose) by mouth every evening., Historical Med    tamsulosin (FLOMAX) 0.4 mg CAPS Take one capsule (0.4 mg dose) by mouth at bedtime., Starting Fri 07/18/2024, Normal    UNABLE TO FIND Take 1 tablet by mouth daily. TRUELIFE PROBIOTIC, Historical Med    valsartan (DIOVAN) 80 MG tablet Oral for 90 Days, Historical Med    VITAMIN D PO Take by mouth every evening., Historical Med        Primary Survey  Primary Survey  Review of Systems   Review of Systems  Constitutional:  Negative for chills and fever.  Respiratory:  Positive for shortness of breath.   Neurological:  Negative for syncope.  All other systems reviewed and are negative.   Physical Exam   ED Triage Vitals [10/10/24 1153]  BP 149/69  Heart Rate 60  Resp 20  SpO2 97 %  Temp 99.4 F (37.4 C)    Physical Exam  Nursing note and vitals reviewed. Constitutional: He appears well-developed and well-nourished. He does not appear distressed, does not appear ill and no respiratory distress.  Eyes: Conjunctivae are normal.  Neck: No JVD.  Cardiovascular: Normal rate and regular rhythm.  Pulmonary/Chest: No respiratory distress. Respiratory effort normal and breath sounds normal.  Musculoskeletal: Normal range of motion. no edema.  Lymphadenopathy:    No cervical adenopathy.  Neurological: He is alert and oriented to person, place, and time.  Skin: Skin is dry.     ED Course   Lab results: No data to display  Imaging: No data to display   ECG: ECG Results   None                                                                        Pre-Sedation Procedures    Medical Decision Making The patient's CTPA noted for peripheral PEs right upper and right lower lobe no strain to the right side heart.  EKG unremarkable he is hemodynamic stable and O2 sat are percent on room air results were  reviewed with the patient.  He will be treated as an outpatient was started on Lovenox and discharged with a prescription for Eliquis.  Patient will follow-up for reassessment with his primary care provider.  The patient was given precautionary instructions for returning to the Emergency Department for reassessment.    Risk Prescription drug management.          Provider  Communication  Discharge Medication List as of 10/10/2024 12:19 PM     START taking these medications   Details  apixaban (ELIQUIS) 5 mg tablet Take two tablets (10 mg total) by mouth 2 (two) times daily for 7 days, followed by 1 tablet (5 mg total) by mouth 2 (two) times daily., Normal        Discharge Medication List as of 10/10/2024 12:19 PM      Discharge Medication List as of 10/10/2024 12:19 PM      Clinical Impression Final diagnoses:  Pulmonary embolism, unspecified chronicity, unspecified pulmonary embolism type, unspecified whether acute cor pulmonale present (*)    ED Disposition     ED Disposition  Discharge   Condition  Stable   Comment  --                 Follow-up Information     Rosina LITTIE Bullock, PA-C. Schedule an appointment as soon as possible for a visit in 1 week.   Specialty: Family Medicine Contact information: [id number] [place] [address] [phone]                  Electronically signed by:       [1] Social History Tobacco Use  Smoking Status Never  Smokeless Tobacco Never  [2] Allergies Allergen Reactions   Xarelto [Rivaroxaban] Nausea Only and Other    Extreme tiredness   Deward Gilmore Constant, MD 10/10/24 2212  "

## 2024-10-14 ENCOUNTER — Telehealth: Payer: Self-pay

## 2024-10-14 NOTE — Telephone Encounter (Signed)
 Pt called with c/o left sided abdominal pain for one day. He was diagnosed over the weekend with a PE and started on Eliquis. He has no other symptoms at this time. He is due back in 9 months for a 2 year surveillance of AAA.SABRA His last duplex showed a 2.8 cm AAA in September 2024. He was sitting in his PCP appt while we spoke waiting to be seen for f/u from PE. He will discuss his abdominal pain with his provider and let us  know if he would like to move up his AAA u/s. Per APP, he could have this discomfort from a myriad of things, unrelated to AAA. I explained this to pt especially in light of his PE and new medication etc. Pt will call us  back after speaking with PCP if he wishes to move up AAA surveillance appt.
